# Patient Record
Sex: Male | Born: 1990 | Hispanic: Yes | Marital: Married | Smoking: Current every day smoker
Health system: Southern US, Community
[De-identification: ages and names within clinical notes are randomized; demographics above are authoritative.]

---

## 2016-06-10 ENCOUNTER — Emergency Department
Admission: EM | Admit: 2016-06-10 | Discharge: 2016-06-10 | Disposition: A | Discharge status: Home / Self Care | Payer: Self-pay | Attending: Emergency Medicine | Admitting: Emergency Medicine

## 2016-06-10 DIAGNOSIS — K529 Noninfective gastroenteritis and colitis, unspecified: Secondary | ICD-10-CM | POA: Insufficient documentation

## 2016-06-10 DIAGNOSIS — R8299 Other abnormal findings in urine: Secondary | ICD-10-CM | POA: Insufficient documentation

## 2016-06-10 LAB — CBC
HEMATOCRIT: 49 % (ref 40.0–52.0)
Hemoglobin: 17.2 g/dL (ref 13.0–18.0)
MCH: 33.3 pg (ref 26.0–34.0)
MCHC: 35.2 g/dL (ref 32.0–36.0)
MCV: 94.8 fL (ref 80.0–100.0)
Platelets: 426 10*3/uL (ref 150–440)
RBC: 5.17 MIL/uL (ref 4.40–5.90)
RDW: 12.3 % (ref 11.5–14.5)
WBC: 6.9 10*3/uL (ref 3.8–10.6)

## 2016-06-10 LAB — COMPREHENSIVE METABOLIC PANEL
ALBUMIN: 4.5 g/dL (ref 3.5–5.0)
ALT: 35 U/L (ref 17–63)
AST: 34 U/L (ref 15–41)
Alkaline Phosphatase: 90 U/L (ref 38–126)
Anion gap: 10 (ref 5–15)
BUN: 6 mg/dL (ref 6–20)
CHLORIDE: 100 mmol/L — AB (ref 101–111)
CO2: 26 mmol/L (ref 22–32)
Calcium: 9.1 mg/dL (ref 8.9–10.3)
Creatinine, Ser: 0.32 mg/dL — ABNORMAL LOW (ref 0.61–1.24)
GFR calc Af Amer: >60 mL/min (ref >60)
GFR calc non Af Amer: >60 mL/min (ref >60)
GLUCOSE: 113 mg/dL — AB (ref 65–99)
POTASSIUM: 3.7 mmol/L (ref 3.5–5.1)
Sodium: 136 mmol/L (ref 135–145)
Total Bilirubin: 0.8 mg/dL (ref 0.3–1.2)
Total Protein: 8.5 g/dL — ABNORMAL HIGH (ref 6.5–8.1)

## 2016-06-10 LAB — URINALYSIS, COMPLETE (UACMP) WITH MICROSCOPIC
BACTERIA UA: NONE SEEN
Bilirubin Urine: NEGATIVE
Glucose, UA: NEGATIVE mg/dL
Hgb urine dipstick: NEGATIVE
KETONES UR: NEGATIVE mg/dL
Nitrite: NEGATIVE
PH: 8 (ref 5.0–8.0)
Protein, ur: 30 mg/dL — AB
SQUAMOUS EPITHELIAL / LPF: NONE SEEN
Specific Gravity, Urine: 1.012 (ref 1.005–1.030)

## 2016-06-10 LAB — LIPASE, BLOOD: LIPASE: 19 U/L (ref 11–51)

## 2016-06-10 MED ORDER — ONDANSETRON 4 MG PO TBDP: 4.0000 mg | ORAL_TABLET | Freq: Three times a day (TID) | ORAL | 0 refills | Status: DC | PRN

## 2016-06-10 MED ORDER — ONDANSETRON HCL 4 MG/2ML IJ SOLN
INTRAMUSCULAR | Status: AC
  Administered 2016-06-10: 4 mg via INTRAVENOUS
  Filled 2016-06-10: qty 2

## 2016-06-10 MED ORDER — ONDANSETRON HCL 4 MG/2ML IJ SOLN
4.0000 mg | Freq: Once | INTRAMUSCULAR | Status: AC
  Administered 2016-06-10: 4 mg via INTRAVENOUS

## 2016-06-10 MED ORDER — LOPERAMIDE HCL 2 MG PO CAPS
4.0000 mg | ORAL_CAPSULE | Freq: Once | ORAL | Status: AC
Start: 2016-06-10 — End: 2016-06-10
  Administered 2016-06-10: 4 mg via ORAL

## 2016-06-10 MED ORDER — SODIUM CHLORIDE 0.9 % IV BOLUS (SEPSIS)
1000.0000 mL | Freq: Once | INTRAVENOUS | Status: AC
  Administered 2016-06-10: 1000 mL via INTRAVENOUS

## 2016-06-10 MED ORDER — LOPERAMIDE HCL 2 MG PO TABS: 2.0000 mg | ORAL_TABLET | Freq: Four times a day (QID) | ORAL | 0 refills | Status: DC | PRN

## 2016-06-10 MED ORDER — LOPERAMIDE HCL 2 MG PO CAPS
ORAL_CAPSULE | ORAL | Status: AC
  Filled 2016-06-10: qty 2

## 2016-06-10 NOTE — ED Triage Notes (Signed)
Pt reports feeling weak and having abd pain and vomiting today.  No diarrhea.  No fever .  Pt alert.  Interpreter with pt

## 2016-06-10 NOTE — ED Provider Notes (Signed)
[facility] Emergency Department Provider Note  Time seen: 6:59 PM  I have reviewed the triage vital signs and the nursing notes.   HISTORY  Chief Complaint Emesis and Weakness    HPI Martin Anderson is a 26 y.o. male with no past medical history who presents to the emergency department for nausea vomiting and diarrhea. According to the patient for the past 2 days he has been very nauseated with multiple episodes of vomiting if he tries to eat, as well as watery diarrhea. Denies any black or bloody stool. Denies any chest pain or abdominal pain. Denies any fever. Patient states he was concerned because he has not been able to keep down any food today.  No past medical history on file.  There are no active problems to display for this patient.   No past surgical history on file.  Prior to Admission medications   Not on File    No Known Allergies  No family history on file.  Social History Social History  Substance Use Topics  . Smoking status: Not on file  . Smokeless tobacco: Not on file  . Alcohol use Not on file    Review of Systems Constitutional: Negative for fever. Eyes: Negative for visual changes. ENT: Negative for congestion Cardiovascular: Negative for chest pain. Respiratory: Negative for shortness of breath. Gastrointestinal: Negative for abdominal pain. Positive for nausea vomiting and diarrhea Genitourinary: Negative for dysuria. Musculoskeletal: Negative for back pain. Skin: Negative for rash. Neurological: Negative for headache All other ROS negative  ____________________________________________   PHYSICAL EXAM:  VITAL SIGNS: ED Triage Vitals  Enc Vitals Group     BP 06/10/16 1748 (!) 146/83     Pulse Rate 06/10/16 1748 91     Resp 06/10/16 1748 18     Temp 06/10/16 1748 99 F (37.2 C)     Temp Source 06/10/16 1748 Oral     SpO2 06/10/16 1748 97 %     Weight 06/10/16 1749 163 lb (73.9 kg)     Height  06/10/16 1749  (1.651 m)     Head Circumference --      Peak Flow --      Pain Score 06/10/16 1759 8     Pain Loc --      Pain Edu? --      Excl. in GC? --     Constitutional: Alert and oriented. Well appearing and in no distress. Eyes: Normal exam ENT   Head: Normocephalic and atraumatic.   Mouth/Throat: Mucous membranes are moist. Cardiovascular: Normal rate, regular rhythm. No murmur Respiratory: Normal respiratory effort without tachypnea nor retractions. Breath sounds are clear  Gastrointestinal: Soft and nontender. No distention.  Musculoskeletal: Nontender with normal range of motion in all extremities.  Neurologic:  Normal speech. No gross focal neurologic deficits Skin:  Skin is warm, dry and intact.  Psychiatric: Mood and affect are normal.   ____________________________________________    INITIAL IMPRESSION / ASSESSMENT AND PLAN / ED COURSE  Pertinent labs & imaging results that were available during my care of the patient were reviewed by me and considered in my medical decision making (see chart for details).  Patient presents to the emergency department with nausea vomiting diarrhea over the past 2 days. Symptoms are very suggestive of gastroenteritis. Completely nontender abdominal exam. Reassuring labs. 6-30 wbc's on urinalysis however no dysuria. We will add on a urine culture but will hold off on treatment at this time.  Patient's labs are  largely within normal limits. Patient is feeling better after medications. We will discharge patient home with Zofran and loperamide. Patient agreeable to plan.  ____________________________________________   FINAL CLINICAL IMPRESSION(S) / ED DIAGNOSES  Enteritis    Minna Antis, MD 06/10/16 2010

## 2016-06-10 NOTE — ED Notes (Signed)
Dr. Lenard Lance and Darke Nation, spanish interpreter at bedside.  2 days feeling weak, diarrhea, and vomiting. Denies pain. Denies blood in stool. States watery diarrhea. Denies urinary symptoms. States HA, unsure of fever at home. Denies vision changes.

## 2016-06-12 LAB — URINE CULTURE

## 2018-01-19 ENCOUNTER — Telehealth: Payer: Self-pay | Admitting: General Practice

## 2018-01-19 NOTE — Telephone Encounter (Signed)
I spoke with spouse and set up appt 02/19/18. PPW mailed.

## 2018-01-19 NOTE — Telephone Encounter (Signed)
Pt speaks limited English and is requesting to est care. Are you willing to accept as new pt? Pt wife said he is having sleeping and anxiety issues currently but not urgent.

## 2018-01-19 NOTE — Telephone Encounter (Signed)
Ok to transfer. Thanks. May place in open 30 min slot.

## 2018-02-19 ENCOUNTER — Ambulatory Visit: Payer: Self-pay | Admitting: Family Medicine

## 2021-07-06 ENCOUNTER — Other Ambulatory Visit: Payer: Self-pay

## 2021-07-06 ENCOUNTER — Emergency Department (HOSPITAL_COMMUNITY)
Admission: EM | Admit: 2021-07-06 | Discharge: 2021-07-06 | Disposition: A | Discharge status: Home / Self Care | Payer: Self-pay | Attending: Emergency Medicine | Admitting: Emergency Medicine

## 2021-07-06 ENCOUNTER — Encounter (HOSPITAL_COMMUNITY): Payer: Self-pay

## 2021-07-06 ENCOUNTER — Emergency Department (HOSPITAL_COMMUNITY): Payer: Self-pay

## 2021-07-06 DIAGNOSIS — J069 Acute upper respiratory infection, unspecified: Secondary | ICD-10-CM | POA: Insufficient documentation

## 2021-07-06 MED ORDER — ACETAMINOPHEN 500 MG PO TABS
1000.0000 mg | ORAL_TABLET | Freq: Once | ORAL | Status: AC
  Administered 2021-07-06: 1000 mg via ORAL
  Filled 2021-07-06: qty 2

## 2021-07-06 MED ORDER — BENZONATATE 100 MG PO CAPS
200.0000 mg | ORAL_CAPSULE | Freq: Once | ORAL | Status: AC
  Administered 2021-07-06: 200 mg via ORAL
  Filled 2021-07-06: qty 2

## 2021-07-06 MED ORDER — BENZONATATE 200 MG PO CAPS: 200.0000 mg | ORAL_CAPSULE | Freq: Three times a day (TID) | ORAL | 0 refills | Status: DC | PRN

## 2021-07-06 NOTE — ED Provider Notes (Signed)
[facility] EMERGENCY DEPARTMENT Provider Note   CSN: [id number] Arrival date & time: 07/06/21  1300     History  Chief Complaint  Patient presents with   Cough    Martin Anderson is a 31 y.o. male.  Martin Anderson is a 31 y.o. male who is otherwise healthy, presents to the emergency department for evaluation of cough.  Patient reports cough has been present for 2 to 3 days.  He reports that cough is typically dry but occasionally he will cough up some sputum.  He reports if he has a prolonged coughing spell it makes him feel a bit short of breath and have some chest soreness and he has coughed up a little bit of blood-streaked mucus after having a significant coughing spell but no bright red blood.  Some rhinorrhea but no sore throat.  No fevers or chills.  No abdominal pain, vomiting or diarrhea.  Patient's significant other reports that she was diagnosed with rhinovirus 1 week ago, and since the patient suspects he has the same.  He has been using cough drops and Mucinex with mild improvement.  Took a dose of his significant other's Tessalon which seemed to help with his cough significantly.  The history is provided by the patient and a significant other.      Home Medications Prior to Admission medications   Medication Sig Start Date End Date Taking? Authorizing Provider  loperamide (IMODIUM A-D) 2 MG tablet Take 1 tablet (2 mg total) by mouth 4 (four) times daily as needed for diarrhea or loose stools. 06/10/16   Minna Antis, MD  ondansetron (ZOFRAN ODT) 4 MG disintegrating tablet Take 1 tablet (4 mg total) by mouth every 8 (eight) hours as needed for nausea or vomiting. 06/10/16   Minna Antis, MD      Allergies    Patient has no known allergies.    Review of Systems   Review of Systems  Constitutional:  Negative for chills and fever.  HENT:  Positive for congestion. Negative for rhinorrhea and sore throat.   Respiratory:  Positive for  cough. Negative for shortness of breath.   Cardiovascular:  Negative for chest pain.  Gastrointestinal:  Negative for abdominal pain, nausea and vomiting.  Genitourinary:  Negative for dysuria.  Musculoskeletal:  Negative for arthralgias and myalgias.  Skin:  Negative for color change and rash.  Neurological:  Negative for dizziness, syncope and light-headedness.  All other systems reviewed and are negative.  Physical Exam Updated Vital Signs BP 132/90   Pulse 76   Temp 99.1 F (37.3 C) (Oral)   Resp 20   Ht 5\' 2"  (1.575 m)   Wt 73.9 kg   SpO2 99%   BMI 29.81 kg/m  Physical Exam Vitals and nursing note reviewed.  Constitutional:      General: He is not in acute distress.    Appearance: Normal appearance. He is well-developed. He is not ill-appearing or diaphoretic.  HENT:     Head: Normocephalic and atraumatic.     Nose: Congestion and rhinorrhea present.     Mouth/Throat:     Mouth: Mucous membranes are moist.     Pharynx: Oropharynx is clear.     Comments: Posterior oropharynx clear and mucous membranes moist, there is mild erythema but no edema or tonsillar exudates, uvula midline, normal phonation, no trismus, tolerating secretions without difficulty. Eyes:     General:        Right eye: No discharge.  Left eye: No discharge.  Neck:     Comments: No rigidity Cardiovascular:     Rate and Rhythm: Normal rate and regular rhythm.     Heart sounds: Normal heart sounds. No murmur heard.   No friction rub. No gallop.  Pulmonary:     Effort: Pulmonary effort is normal. No respiratory distress.     Breath sounds: Normal breath sounds.     Comments: Respirations equal and unlabored, patient able to speak in full sentences, lungs clear to auscultation bilaterally  Abdominal:     General: Bowel sounds are normal. There is no distension.     Palpations: Abdomen is soft. There is no mass.     Tenderness: There is no abdominal tenderness. There is no guarding.      Comments: Abdomen soft, nondistended, nontender to palpation in all quadrants without guarding or peritoneal signs  Musculoskeletal:        General: No deformity.     Cervical back: Neck supple.  Lymphadenopathy:     Cervical: No cervical adenopathy.  Skin:    General: Skin is warm and dry.     Capillary Refill: Capillary refill takes less than 2 seconds.  Neurological:     Mental Status: He is alert and oriented to person, place, and time.  Psychiatric:        Mood and Affect: Mood normal.        Behavior: Behavior normal.    ED Results / Procedures / Treatments   Labs (all labs ordered are listed, but only abnormal results are displayed) Labs Reviewed - No data to display  EKG None  Radiology DG Chest 2 View  Result Date: 07/06/2021 CLINICAL DATA:  Cough. EXAM: CHEST - 2 VIEW COMPARISON:  None Available. FINDINGS: Low lung volumes. No consolidation. No visible pleural effusions or pneumothorax. Cardiomediastinal silhouette is within normal limits. No displaced fracture. IMPRESSION: Low lung volumes without evidence of acute cardiopulmonary disease. Electronically Signed   By: Feliberto HartsFrederick S Jones M.D.   On: 07/06/2021 14:26    Procedures Procedures    Medications Ordered in ED Medications  benzonatate (TESSALON) capsule 200 mg (has no administration in time range)  acetaminophen (TYLENOL) tablet 1,000 mg (has no administration in time range)    ED Course/ Medical Decision Making/ A&P                           Medical Decision Making Amount and/or Complexity of Data Reviewed Radiology: ordered.   Pt presents with cough.  Differential includes viral URI, pneumonia, bronchitis.  Presentation is less concerning for asthma, COPD, CHF or PE.  Patient did have some blood-streaked mucus but no larger volume or persistent hemoptysis.  Pt is well appearing and vitals are normal. Lungs CTA on exam. Pt CXR negative for acute infiltrate. Patients symptoms are consistent with URI,  likely viral etiology.  Known sick contact with rhinovirus.  Discussed that antibiotics are not indicated for viral infections. Pt will be discharged with symptomatic treatment.  Verbalizes understanding and is agreeable with plan. Pt is hemodynamically stable & in NAD prior to dc. Return precautions discussed, pt expresses understanding and agrees with plan.         Final Clinical Impression(s) / ED Diagnoses Final diagnoses:  Viral URI with cough    Rx / DC Orders ED Discharge Orders          Ordered    benzonatate (TESSALON) 200 MG capsule  3 times  daily PRN        07/06/21 1452              Dartha Lodge, [place] 07/06/21 1541    Sloan Leiter, DO 07/08/21 [phone]

## 2021-07-06 NOTE — ED Triage Notes (Addendum)
Pt arrived POV from home c/o chest pain and a cough that started yesterday. Pt states when he gets to coughing he gets [facility] and coughs up blood and that is when is chest hurts otherwise no pain at this time if he is not coughing.

## 2021-07-06 NOTE — Discharge Instructions (Addendum)
Your symptoms are likely caused by a viral upper respiratory infection. Antibiotics are not helpful in treating viral infection, the virus should run its course in about 7-10 days. Please make sure you are drinking plenty of fluids. You can treat your symptoms supportively with tylenol/ibuprofen for fevers and pains, and prescribed cough medication and throat lozenges to help with cough. If your symptoms are not improving please follow up with you Primary doctor.   If you develop persistent fevers, shortness of breath or difficulty breathing, chest pain, severe headache and neck pain, persistent nausea and vomiting or other new or concerning symptoms return to the Emergency department.

## 2023-06-13 IMAGING — CR DG CHEST 2V
2 series · 2 of 2 positions shown · non-contrast
Comparison: None Available.

CLINICAL DATA: Cough.

EXAM:
CHEST - 2 VIEW

[chest lat]
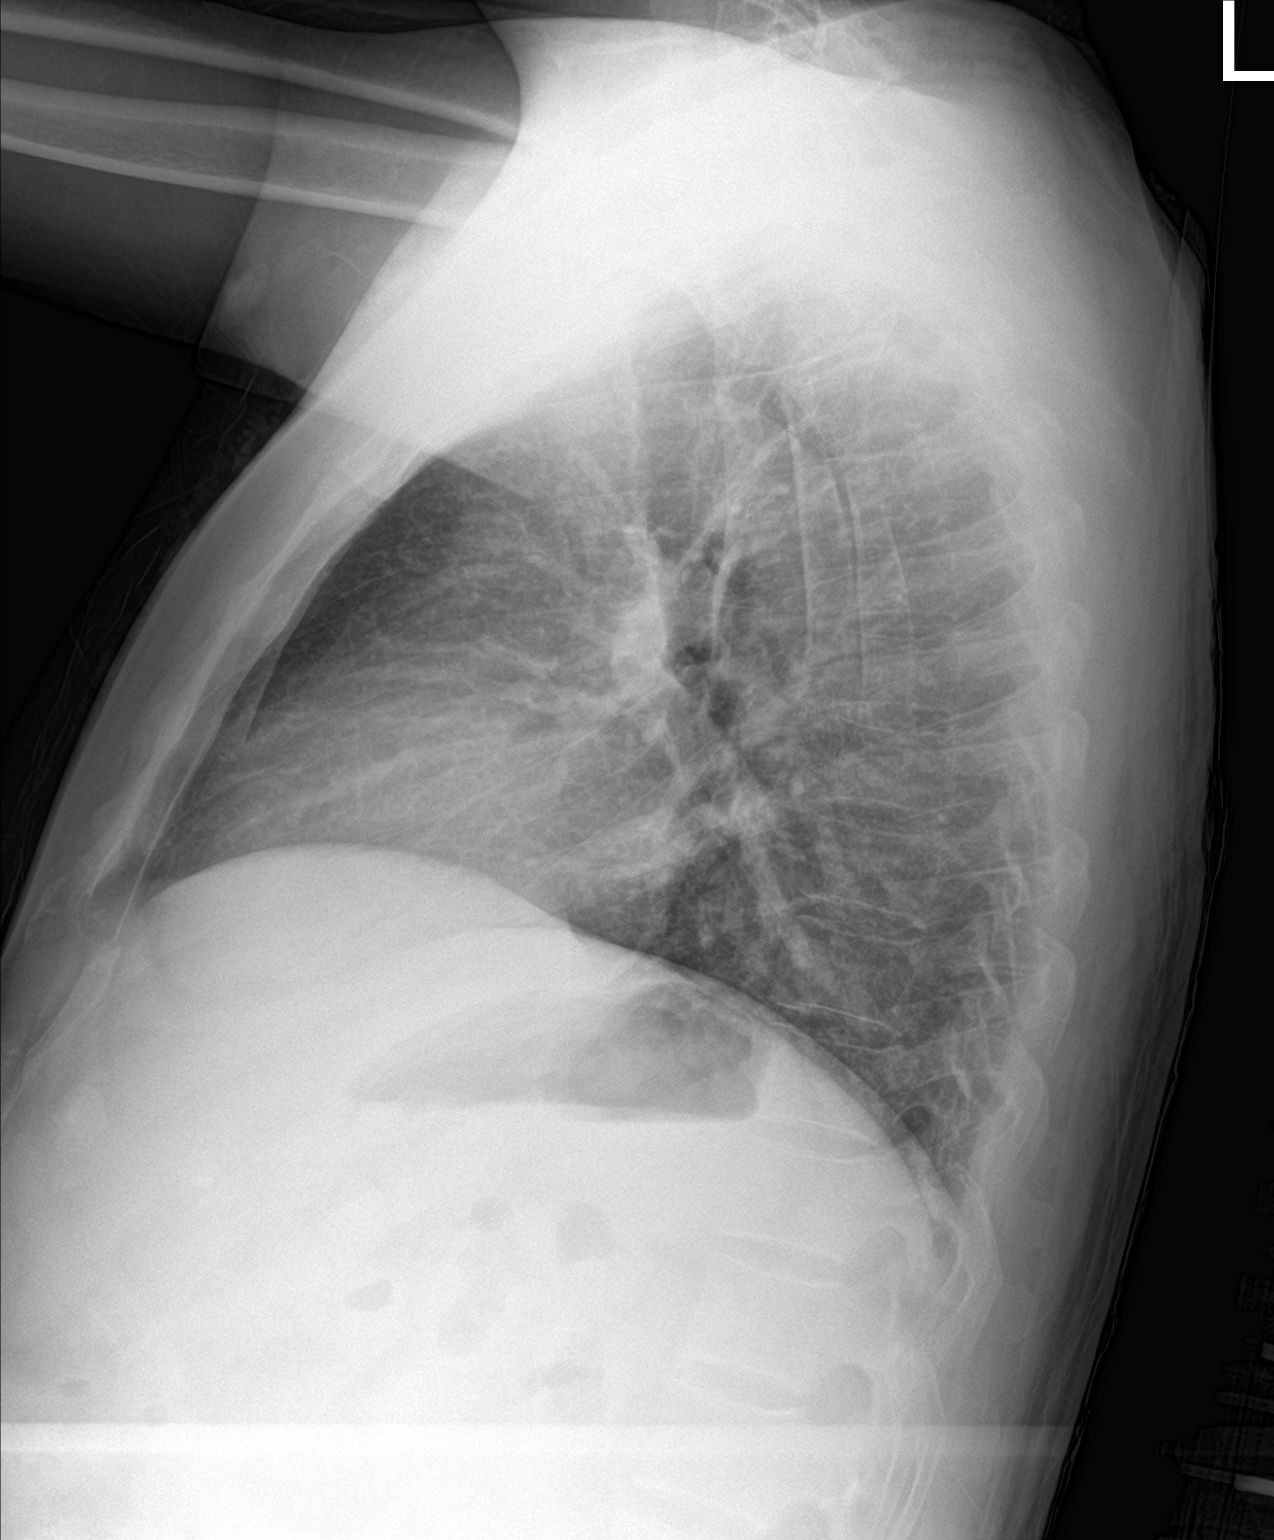

[chest ap]
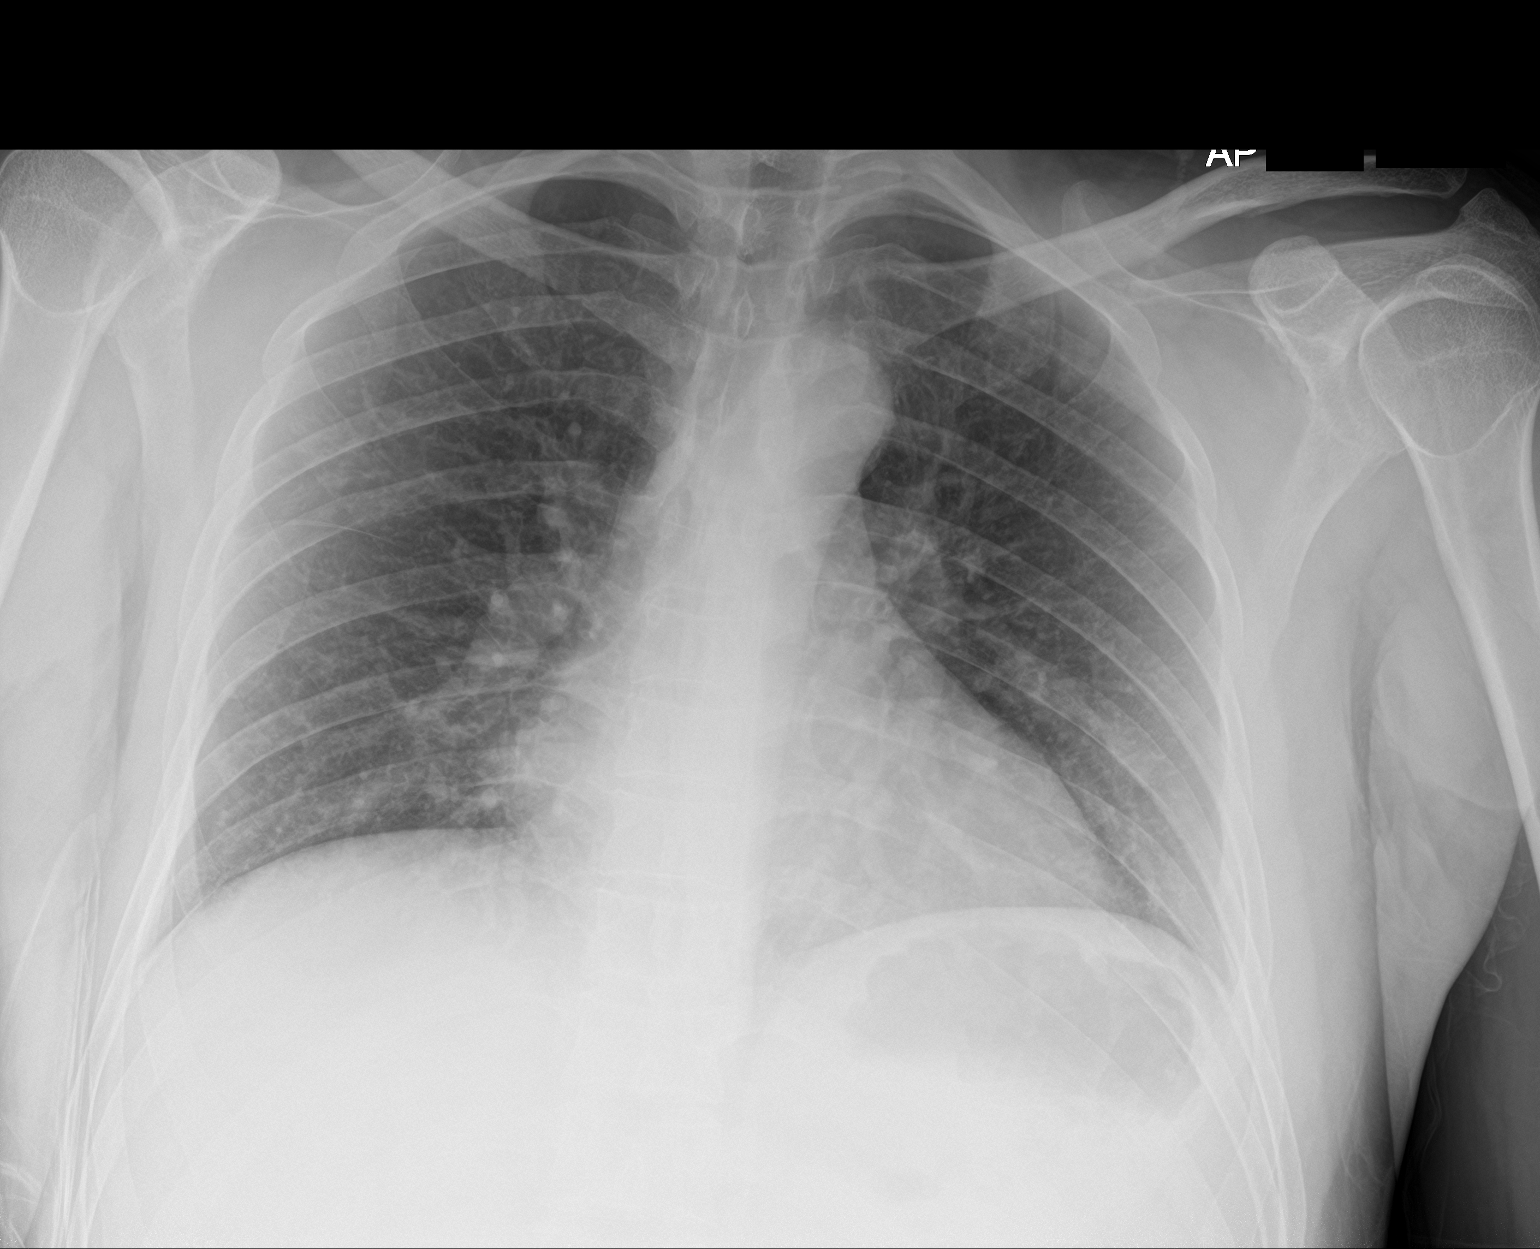

[2 of 2 positions shown; findings below may reference images not displayed]

FINDINGS: Low lung volumes. No consolidation. No visible pleural effusions or
pneumothorax. Cardiomediastinal silhouette is within normal limits.
No displaced fracture.
IMPRESSION: Low lung volumes without evidence of acute cardiopulmonary disease.

## 2023-12-04 ENCOUNTER — Encounter (HOSPITAL_COMMUNITY): Admission: EM | Disposition: A | Payer: Self-pay | Source: Home / Self Care

## 2023-12-04 ENCOUNTER — Inpatient Hospital Stay (HOSPITAL_COMMUNITY): Payer: MEDICAID | Admitting: Anesthesiology

## 2023-12-04 ENCOUNTER — Inpatient Hospital Stay (HOSPITAL_COMMUNITY)
Admission: EM | Admit: 2023-12-04 | Discharge: 2023-12-07 | DRG: 432 | Disposition: A | Discharge status: Home / Self Care | Payer: MEDICAID

## 2023-12-04 ENCOUNTER — Encounter (HOSPITAL_COMMUNITY): Payer: Self-pay | Admitting: Family Medicine

## 2023-12-04 ENCOUNTER — Other Ambulatory Visit: Payer: Self-pay

## 2023-12-04 ENCOUNTER — Emergency Department (HOSPITAL_COMMUNITY): Payer: MEDICAID

## 2023-12-04 DIAGNOSIS — Z79899 Other long term (current) drug therapy: Secondary | ICD-10-CM

## 2023-12-04 DIAGNOSIS — Z6372 Alcoholism and drug addiction in family: Secondary | ICD-10-CM

## 2023-12-04 DIAGNOSIS — K922 Gastrointestinal hemorrhage, unspecified: Principal | ICD-10-CM | POA: Diagnosis present

## 2023-12-04 DIAGNOSIS — K7030 Alcoholic cirrhosis of liver without ascites: Principal | ICD-10-CM | POA: Diagnosis present

## 2023-12-04 DIAGNOSIS — D7589 Other specified diseases of blood and blood-forming organs: Secondary | ICD-10-CM | POA: Diagnosis present

## 2023-12-04 DIAGNOSIS — E871 Hypo-osmolality and hyponatremia: Secondary | ICD-10-CM | POA: Diagnosis present

## 2023-12-04 DIAGNOSIS — R7989 Other specified abnormal findings of blood chemistry: Secondary | ICD-10-CM | POA: Diagnosis present

## 2023-12-04 DIAGNOSIS — K254 Chronic or unspecified gastric ulcer with hemorrhage: Secondary | ICD-10-CM | POA: Diagnosis present

## 2023-12-04 DIAGNOSIS — D62 Acute posthemorrhagic anemia: Secondary | ICD-10-CM | POA: Diagnosis present

## 2023-12-04 DIAGNOSIS — K709 Alcoholic liver disease, unspecified: Secondary | ICD-10-CM

## 2023-12-04 DIAGNOSIS — Z720 Tobacco use: Secondary | ICD-10-CM | POA: Diagnosis present

## 2023-12-04 DIAGNOSIS — K766 Portal hypertension: Secondary | ICD-10-CM

## 2023-12-04 DIAGNOSIS — F17210 Nicotine dependence, cigarettes, uncomplicated: Secondary | ICD-10-CM | POA: Diagnosis present

## 2023-12-04 DIAGNOSIS — I781 Nevus, non-neoplastic: Secondary | ICD-10-CM | POA: Diagnosis present

## 2023-12-04 DIAGNOSIS — F1010 Alcohol abuse, uncomplicated: Secondary | ICD-10-CM | POA: Diagnosis present

## 2023-12-04 DIAGNOSIS — Z811 Family history of alcohol abuse and dependence: Secondary | ICD-10-CM

## 2023-12-04 DIAGNOSIS — Z603 Acculturation difficulty: Secondary | ICD-10-CM | POA: Diagnosis present

## 2023-12-04 DIAGNOSIS — R935 Abnormal findings on diagnostic imaging of other abdominal regions, including retroperitoneum: Secondary | ICD-10-CM | POA: Diagnosis present

## 2023-12-04 DIAGNOSIS — K3189 Other diseases of stomach and duodenum: Secondary | ICD-10-CM

## 2023-12-04 DIAGNOSIS — I8510 Secondary esophageal varices without bleeding: Secondary | ICD-10-CM | POA: Diagnosis present

## 2023-12-04 DIAGNOSIS — D72829 Elevated white blood cell count, unspecified: Secondary | ICD-10-CM | POA: Diagnosis present

## 2023-12-04 DIAGNOSIS — F1020 Alcohol dependence, uncomplicated: Secondary | ICD-10-CM | POA: Diagnosis present

## 2023-12-04 DIAGNOSIS — E876 Hypokalemia: Secondary | ICD-10-CM | POA: Diagnosis present

## 2023-12-04 DIAGNOSIS — D539 Nutritional anemia, unspecified: Secondary | ICD-10-CM | POA: Diagnosis present

## 2023-12-04 DIAGNOSIS — K920 Hematemesis: Secondary | ICD-10-CM | POA: Diagnosis present

## 2023-12-04 DIAGNOSIS — K2921 Alcoholic gastritis with bleeding: Secondary | ICD-10-CM

## 2023-12-04 DIAGNOSIS — D689 Coagulation defect, unspecified: Secondary | ICD-10-CM | POA: Diagnosis present

## 2023-12-04 DIAGNOSIS — I8500 Esophageal varices without bleeding: Secondary | ICD-10-CM

## 2023-12-04 DIAGNOSIS — R933 Abnormal findings on diagnostic imaging of other parts of digestive tract: Secondary | ICD-10-CM

## 2023-12-04 DIAGNOSIS — R197 Diarrhea, unspecified: Secondary | ICD-10-CM | POA: Diagnosis present

## 2023-12-04 DIAGNOSIS — K259 Gastric ulcer, unspecified as acute or chronic, without hemorrhage or perforation: Secondary | ICD-10-CM

## 2023-12-04 HISTORY — PX: ESOPHAGOGASTRODUODENOSCOPY: SHX5428

## 2023-12-04 LAB — TYPE AND SCREEN
ABO/RH(D): O POS
Antibody Screen: NEGATIVE

## 2023-12-04 LAB — COMPREHENSIVE METABOLIC PANEL WITH GFR
ALT: 23 U/L (ref 0–44)
AST: 47 U/L — ABNORMAL HIGH (ref 15–41)
Albumin: 3.7 g/dL (ref 3.5–5.0)
Alkaline Phosphatase: 69 U/L (ref 38–126)
Anion gap: 17 — ABNORMAL HIGH (ref 5–15)
BUN: 17 mg/dL (ref 6–20)
CO2: 23 mmol/L (ref 22–32)
Calcium: 8.7 mg/dL — ABNORMAL LOW (ref 8.9–10.3)
Chloride: 84 mmol/L — ABNORMAL LOW (ref 98–111)
Creatinine, Ser: 0.93 mg/dL (ref 0.61–1.24)
GFR, Estimated: >60 mL/min (ref >60)
Glucose, Bld: 146 mg/dL — ABNORMAL HIGH (ref 70–99)
Potassium: 3.4 mmol/L — ABNORMAL LOW (ref 3.5–5.1)
Sodium: 124 mmol/L — ABNORMAL LOW (ref 135–145)
Total Bilirubin: 2 mg/dL — ABNORMAL HIGH (ref 0.0–1.2)
Total Protein: 7.8 g/dL (ref 6.5–8.1)

## 2023-12-04 LAB — URINALYSIS, ROUTINE W REFLEX MICROSCOPIC
Bilirubin Urine: NEGATIVE
Glucose, UA: NEGATIVE mg/dL
Hgb urine dipstick: NEGATIVE
Ketones, ur: 5 mg/dL — AB
Leukocytes,Ua: NEGATIVE
Nitrite: NEGATIVE
Protein, ur: NEGATIVE mg/dL
Specific Gravity, Urine: 1.039 — ABNORMAL HIGH (ref 1.005–1.030)
pH: 7 (ref 5.0–8.0)

## 2023-12-04 LAB — LIPASE, BLOOD: Lipase: 30 U/L (ref 11–51)

## 2023-12-04 LAB — CBC
HCT: 27.3 % — ABNORMAL LOW (ref 39.0–52.0)
Hemoglobin: 9.6 g/dL — ABNORMAL LOW (ref 13.0–17.0)
MCH: 36.6 pg — ABNORMAL HIGH (ref 26.0–34.0)
MCHC: 35.2 g/dL (ref 30.0–36.0)
MCV: 104.2 fL — ABNORMAL HIGH (ref 80.0–100.0)
Platelets: 204 K/uL (ref 150–400)
RBC: 2.62 MIL/uL — ABNORMAL LOW (ref 4.22–5.81)
RDW: 11.7 % (ref 11.5–15.5)
WBC: 13.6 K/uL — ABNORMAL HIGH (ref 4.0–10.5)
nRBC: 0 % (ref 0.0–0.2)

## 2023-12-04 LAB — ABO/RH: ABO/RH(D): O POS

## 2023-12-04 LAB — POC OCCULT BLOOD, ED: Fecal Occult Blood, POC: POSITIVE — AB

## 2023-12-04 LAB — HEMOGLOBIN AND HEMATOCRIT, BLOOD
HCT: 26.2 % — ABNORMAL LOW (ref 39.0–52.0)
HCT: 24.9 % — ABNORMAL LOW (ref 39.0–52.0)
Hemoglobin: 9.4 g/dL — ABNORMAL LOW (ref 13.0–17.0)
Hemoglobin: 8.7 g/dL — ABNORMAL LOW (ref 13.0–17.0)

## 2023-12-04 LAB — PROTIME-INR
INR: 1.7 — ABNORMAL HIGH (ref 0.8–1.2)
Prothrombin Time: 20.4 s — ABNORMAL HIGH (ref 11.4–15.2)

## 2023-12-04 LAB — PHOSPHORUS: Phosphorus: 3.2 mg/dL (ref 2.5–4.6)

## 2023-12-04 LAB — MAGNESIUM: Magnesium: 2 mg/dL (ref 1.7–2.4)

## 2023-12-04 LAB — HIV ANTIBODY (ROUTINE TESTING W REFLEX): HIV Screen 4th Generation wRfx: NONREACTIVE

## 2023-12-04 SURGERY — EGD (ESOPHAGOGASTRODUODENOSCOPY)
Anesthesia: General

## 2023-12-04 MED ORDER — LORAZEPAM 2 MG/ML IJ SOLN: 1.0000 mg | INTRAMUSCULAR | Status: AC | PRN

## 2023-12-04 MED ORDER — ACETAMINOPHEN 325 MG PO TABS: 650.0000 mg | ORAL_TABLET | Freq: Four times a day (QID) | ORAL | Status: DC | PRN

## 2023-12-04 MED ORDER — MORPHINE SULFATE (PF) 2 MG/ML IV SOLN: 2.0000 mg | INTRAVENOUS | Status: DC | PRN

## 2023-12-04 MED ORDER — MIDAZOLAM HCL (PF) 2 MG/2ML IJ SOLN
INTRAMUSCULAR | Status: DC | PRN
  Administered 2023-12-04: 2 mg via INTRAVENOUS

## 2023-12-04 MED ORDER — SUCCINYLCHOLINE CHLORIDE 200 MG/10ML IV SOSY
PREFILLED_SYRINGE | INTRAVENOUS | Status: DC | PRN
  Administered 2023-12-04: 100 mg via INTRAVENOUS

## 2023-12-04 MED ORDER — PANTOPRAZOLE SODIUM 40 MG IV SOLR
40.0000 mg | Freq: Two times a day (BID) | INTRAVENOUS | Status: DC
  Administered 2023-12-04 – 2023-12-05 (×2): 40 mg via INTRAVENOUS
  Filled 2023-12-04 (×2): qty 10

## 2023-12-04 MED ORDER — ONDANSETRON HCL 4 MG/2ML IJ SOLN: 4.0000 mg | Freq: Four times a day (QID) | INTRAMUSCULAR | Status: DC | PRN

## 2023-12-04 MED ORDER — MORPHINE SULFATE (PF) 4 MG/ML IV SOLN
4.0000 mg | Freq: Once | INTRAVENOUS | Status: AC
  Administered 2023-12-04: 4 mg via INTRAVENOUS
  Filled 2023-12-04: qty 1

## 2023-12-04 MED ORDER — THIAMINE HCL 100 MG/ML IJ SOLN
100.0000 mg | Freq: Every day | INTRAMUSCULAR | Status: DC
  Administered 2023-12-04 – 2023-12-06 (×2): 100 mg via INTRAVENOUS
  Filled 2023-12-04: qty 2

## 2023-12-04 MED ORDER — SODIUM CHLORIDE 0.9 % IV SOLN: INTRAVENOUS | Status: AC

## 2023-12-04 MED ORDER — LORAZEPAM 1 MG PO TABS: 1.0000 mg | ORAL_TABLET | ORAL | Status: AC | PRN

## 2023-12-04 MED ORDER — OXYCODONE HCL 5 MG PO TABS
5.0000 mg | ORAL_TABLET | ORAL | Status: DC | PRN
Start: 2023-12-04 — End: 2023-12-07
  Administered 2023-12-04 – 2023-12-05 (×2): 5 mg via ORAL
  Filled 2023-12-04 (×2): qty 1

## 2023-12-04 MED ORDER — ONDANSETRON HCL 4 MG PO TABS: 4.0000 mg | ORAL_TABLET | Freq: Four times a day (QID) | ORAL | Status: DC | PRN

## 2023-12-04 MED ORDER — ONDANSETRON HCL 4 MG/2ML IJ SOLN
INTRAMUSCULAR | Status: DC | PRN
  Administered 2023-12-04: 4 mg via INTRAVENOUS

## 2023-12-04 MED ORDER — ACETAMINOPHEN 650 MG RE SUPP: 650.0000 mg | Freq: Four times a day (QID) | RECTAL | Status: DC | PRN

## 2023-12-04 MED ORDER — THIAMINE MONONITRATE 100 MG PO TABS
100.0000 mg | ORAL_TABLET | Freq: Every day | ORAL | Status: DC
  Administered 2023-12-05 – 2023-12-07 (×2): 100 mg via ORAL
  Filled 2023-12-04 (×4): qty 1

## 2023-12-04 MED ORDER — SODIUM CHLORIDE 0.9 % IV SOLN: INTRAVENOUS | Status: DC

## 2023-12-04 MED ORDER — LIDOCAINE 2% (20 MG/ML) 5 ML SYRINGE
INTRAMUSCULAR | Status: DC | PRN
Start: 2023-12-04 — End: 2023-12-04
  Administered 2023-12-04: 60 mg via INTRAVENOUS

## 2023-12-04 MED ORDER — ONDANSETRON 4 MG PO TBDP: 4.0000 mg | ORAL_TABLET | Freq: Once | ORAL | Status: DC | PRN

## 2023-12-04 MED ORDER — PANTOPRAZOLE SODIUM 40 MG IV SOLR
80.0000 mg | Freq: Once | INTRAVENOUS | Status: AC
  Administered 2023-12-04: 80 mg via INTRAVENOUS
  Filled 2023-12-04: qty 20

## 2023-12-04 MED ORDER — ONDANSETRON HCL 4 MG/2ML IJ SOLN
4.0000 mg | Freq: Once | INTRAMUSCULAR | Status: AC
  Administered 2023-12-04: 4 mg via INTRAVENOUS
  Filled 2023-12-04: qty 2

## 2023-12-04 MED ORDER — LORAZEPAM 2 MG/ML IJ SOLN: 0.0000 mg | Freq: Four times a day (QID) | INTRAMUSCULAR | Status: AC

## 2023-12-04 MED ORDER — SODIUM CHLORIDE 0.9 % IV BOLUS
1000.0000 mL | Freq: Once | INTRAVENOUS | Status: AC
  Administered 2023-12-04: 1000 mL via INTRAVENOUS

## 2023-12-04 MED ORDER — SODIUM CHLORIDE 0.9 % IV SOLN
50.0000 ug/h | INTRAVENOUS | Status: DC
  Administered 2023-12-04 – 2023-12-05 (×3): 50 ug/h via INTRAVENOUS
  Filled 2023-12-04 (×4): qty 1

## 2023-12-04 MED ORDER — PROPOFOL 10 MG/ML IV BOLUS
INTRAVENOUS | Status: DC | PRN
  Administered 2023-12-04 (×2): 50 mg via INTRAVENOUS
  Administered 2023-12-04: 150 mg via INTRAVENOUS

## 2023-12-04 MED ORDER — MIDAZOLAM HCL 2 MG/2ML IJ SOLN
INTRAMUSCULAR | Status: AC
  Filled 2023-12-04: qty 2

## 2023-12-04 MED ORDER — SODIUM CHLORIDE 0.9% FLUSH
3.0000 mL | Freq: Two times a day (BID) | INTRAVENOUS | Status: DC
  Administered 2023-12-04 – 2023-12-07 (×5): 3 mL via INTRAVENOUS

## 2023-12-04 MED ORDER — FOLIC ACID 1 MG PO TABS
1.0000 mg | ORAL_TABLET | Freq: Every day | ORAL | Status: DC
  Administered 2023-12-05 – 2023-12-07 (×3): 1 mg via ORAL
  Filled 2023-12-04 (×4): qty 1

## 2023-12-04 MED ORDER — OCTREOTIDE LOAD VIA INFUSION
50.0000 ug | Freq: Once | INTRAVENOUS | Status: DC
  Filled 2023-12-04: qty 25

## 2023-12-04 MED ORDER — ADULT MULTIVITAMIN W/MINERALS CH
1.0000 | ORAL_TABLET | Freq: Every day | ORAL | Status: DC
  Administered 2023-12-05 – 2023-12-07 (×3): 1 via ORAL
  Filled 2023-12-04 (×4): qty 1

## 2023-12-04 MED ORDER — IOHEXOL 350 MG/ML SOLN
75.0000 mL | Freq: Once | INTRAVENOUS | Status: AC | PRN
Start: 2023-12-04 — End: 2023-12-04
  Administered 2023-12-04: 75 mL via INTRAVENOUS

## 2023-12-04 MED ORDER — ROCURONIUM BROMIDE 10 MG/ML (PF) SYRINGE
PREFILLED_SYRINGE | INTRAVENOUS | Status: DC | PRN
  Administered 2023-12-04: 4 mg via INTRAVENOUS

## 2023-12-04 MED ORDER — DEXAMETHASONE SOD PHOSPHATE PF 10 MG/ML IJ SOLN
INTRAMUSCULAR | Status: DC | PRN
  Administered 2023-12-04: 5 mg via INTRAVENOUS

## 2023-12-04 MED ORDER — ALBUTEROL SULFATE (2.5 MG/3ML) 0.083% IN NEBU: 2.5000 mg | INHALATION_SOLUTION | Freq: Four times a day (QID) | RESPIRATORY_TRACT | Status: DC | PRN

## 2023-12-04 MED ORDER — LORAZEPAM 2 MG/ML IJ SOLN: 0.0000 mg | Freq: Two times a day (BID) | INTRAMUSCULAR | Status: DC

## 2023-12-04 MED ORDER — NICOTINE 21 MG/24HR TD PT24
21.0000 mg | MEDICATED_PATCH | Freq: Every day | TRANSDERMAL | Status: DC
  Administered 2023-12-04 – 2023-12-07 (×4): 21 mg via TRANSDERMAL
  Filled 2023-12-04 (×4): qty 1

## 2023-12-04 NOTE — ED Notes (Addendum)
Transport called to take pt upstairs 

## 2023-12-04 NOTE — Transfer of Care (Signed)
 Immediate Anesthesia Transfer of Care Note  Patient: Martin Anderson  Procedure(s) Performed: EGD (ESOPHAGOGASTRODUODENOSCOPY)  Patient Location: PACU  Anesthesia Type:General  Level of Consciousness: drowsy  Airway & Oxygen Therapy: Patient Spontanous Breathing  Post-op Assessment: Report given to RN and Patient moving all extremities X 4  Post vital signs: Reviewed and stable  Last Vitals:  Vitals Value Taken Time  BP 105/73 12/04/23 13:30  Temp    Pulse 97 12/04/23 13:34  Resp 13 12/04/23 13:34  SpO2 97 % 12/04/23 13:34  Vitals shown include unfiled device data.  Last Pain:  Vitals:   12/04/23 1327  TempSrc:   PainSc: Asleep         Complications: No notable events documented.

## 2023-12-04 NOTE — ED Triage Notes (Addendum)
 Pt arrives POV with wife complaining of abd pain and n/v/d x a week. Pt drinks about 12-18 beers a day with the last drink being 2 hours prior to arrival. States he started throwing up dark blood clots today. Wife states that pt eyes and skin seem discolored and are more yellow than normal.

## 2023-12-04 NOTE — Interval H&P Note (Signed)
 History and Physical Interval Note:  12/04/2023 12:42 PM  Martin Anderson  has presented today for surgery, with the diagnosis of Hematemesis.  The various methods of treatment have been discussed with the patient and family. After consideration of risks, benefits and other options for treatment, the patient has consented to  Procedure(s): EGD (ESOPHAGOGASTRODUODENOSCOPY) (N/A) as a surgical intervention.  The patient's history has been reviewed, patient examined, no change in status, stable for surgery.  I have reviewed the patient's chart and labs.  Questions were answered to the patient's satisfaction.     Martin Anderson

## 2023-12-04 NOTE — ED Notes (Addendum)
 RN is not sure is the loading dose of the Octreotide was given at 11:45am. RN did not want to give another loading dose.

## 2023-12-04 NOTE — Op Note (Signed)
 [facility] Patient Name: Martin Anderson Procedure Date : 12/04/2023 MRN: [id number] Attending MD: Victory CROME. Legrand , MD, [id number] Date of Birth: 1991/02/01 CSN: [id number] Age: 33 Admit Type: Inpatient Procedure:                Upper GI endoscopy Indications:              Acute post hemorrhagic anemia, Hematemesis                           see consult note. new Dx Etoh-related cirrhosis Providers:                Victory L. Legrand, MD, Jacquelyn Jaci Pierce, RN,                            Felice Sar, Technician Referring MD:             [facility] Medicines:                Monitored Anesthesia Care Complications:            No immediate complications. Estimated Blood Loss:     Estimated blood loss: none. Procedure:                Pre-Anesthesia Assessment:                           - Prior to the procedure, a History and Physical                            was performed, and patient medications and                            allergies were reviewed. The patient's tolerance of                            previous anesthesia was also reviewed. The risks                            and benefits of the procedure and the sedation                            options and risks were discussed with the patient.                            All questions were answered, and informed consent                            was obtained. Prior Anticoagulants: The patient has                            taken no anticoagulant or antiplatelet agents. ASA                            Grade Assessment: IV - A patient with severe  systemic disease that is a constant threat to life.                            After reviewing the risks and benefits, the patient                            was deemed in satisfactory condition to undergo the                            procedure.                           After obtaining informed consent, the endoscope was                             passed under direct vision. Throughout the                            procedure, the patient's blood pressure, pulse, and                            oxygen saturations were monitored continuously. The                            [id number]-H190 ([id number]) Olympus endoscope was introduced                            through the mouth, and advanced to the second part                            of duodenum. The upper GI endoscopy was                            accomplished without difficulty. The patient                            tolerated the procedure well. Scope In: Scope Out: Findings:      There was scant fresh blood in the hypopharynx from either intubation or       suction.      Grade II varices were found in the middle third of the esophagus and in       the lower third of the esophagus. There were a few shallow overlying       erosions. No active bleeding or stigmata of bleeding.      Moderate portal hypertensive gastropathy was found in the entire       examined stomach. Most prominent in fundus and proximal gastric body.       Not actively bleeding or friable with irrigation. No fresh or old blood       in the UGI tract.      The cardia and gastric fundus were normal on retroflexion. No gastric       varices seen. No biopsies were taken in the stomach with an INR of 1.7      A single erosion or very shallow diminutive ulcer with no bleeding was  found in the cardia,along the lesser curvature. There was a faint       central light red spot within the mucosal defect.      The examined duodenum was normal. Impression:               - Grade II esophageal varices.                           - Portal hypertensive gastropathy.                           - Gastric erosion with no bleeding. Cannot be                            certain, but may have been bleeding site. Not                            overlying a varix and not amenable to endoscopic                             intervention.                           - Normal examined duodenum.                           - No specimens collected.                           - Scant fresh blood (not actively bleding during                            observation) in the hypopharynx Recommendation:           - Return patient to hospital ward for ongoing care.                           - Clear liquid diet.                           - Continue octreotide 50 micrograms/hr                           Eventual carvedilol for variceal bleeding                            prophylaxis.(not until closer to discharge)                           Q 12 hour Hgb/Hct x 48 hours                           Pantroprazole 40 mg IV BID                           Watch closely for alcohol withdrawal.  Stool antigen for H pylori Procedure Code(s):        --- Professional ---                           [id number], Esophagogastroduodenoscopy, flexible,                            transoral; diagnostic, including collection of                            specimen(s) by brushing or washing, when performed                            (separate procedure) Diagnosis Code(s):        --- Professional ---                           I85.00, Esophageal varices without bleeding                           K76.6, Portal hypertension                           K31.89, Other diseases of stomach and duodenum                           K25.9, Gastric ulcer, unspecified as acute or                            chronic, without hemorrhage or perforation                           D62, Acute posthemorrhagic anemia                           K92.0, Hematemesis CPT copyright 2022 American Medical Association. All rights reserved. The codes documented in this report are preliminary and upon coder review may  be revised to meet current compliance requirements. Haunani Dickard L. Legrand, MD 12/04/2023 1:29:56 PM This report has been signed electronically. Number of  Addenda: 0

## 2023-12-04 NOTE — Anesthesia Postprocedure Evaluation (Signed)
 Anesthesia Post Note  Patient: Martin Anderson  Procedure(s) Performed: EGD (ESOPHAGOGASTRODUODENOSCOPY)     Patient location during evaluation: PACU Anesthesia Type: General Level of consciousness: awake and alert Pain management: pain level controlled Vital Signs Assessment: post-procedure vital signs reviewed and stable Respiratory status: spontaneous breathing, nonlabored ventilation, respiratory function stable and patient connected to nasal cannula oxygen Cardiovascular status: blood pressure returned to baseline and stable Postop Assessment: no apparent nausea or vomiting Anesthetic complications: no   No notable events documented.  Last Vitals:  Vitals:   12/04/23 1335 12/04/23 1340  BP:  110/72  Pulse: 97 93  Resp: 13 13  Temp:    SpO2: 97% 97%    Last Pain:  Vitals:   12/04/23 1327  TempSrc:   PainSc: Asleep                 Rome Ade

## 2023-12-04 NOTE — Anesthesia Preprocedure Evaluation (Signed)
 Anesthesia Evaluation  Patient identified by MRN, date of birth, ID band Patient awake  General Assessment Comment:  Hematemesis workup. Last hematemesis last night  Reviewed: Allergy & Precautions, NPO status , Patient's Chart, lab work & pertinent test results  History of Anesthesia Complications Negative for: history of anesthetic complications  Airway Mallampati: II  TM Distance: >3 FB Neck ROM: Full    Dental no notable dental hx. (+) Teeth Intact   Pulmonary neg sleep apnea, neg COPD, Current Smoker and Patient abstained from smoking.   Pulmonary exam normal breath sounds clear to auscultation       Cardiovascular Exercise Tolerance: Good METS(-) hypertension(-) CAD and (-) Past MI negative cardio ROS (-) dysrhythmias  Rhythm:Regular Rate:Normal - Systolic murmurs    Neuro/Psych negative neurological ROS  negative psych ROS   GI/Hepatic ,neg GERD  ,,(+)     substance abuse  alcohol useSigns of early cirrhosis on CT   Endo/Other  neg diabetes    Renal/GU negative Renal ROS     Musculoskeletal   Abdominal   Peds  Hematology   Anesthesia Other Findings History reviewed. No pertinent past medical history.  Reproductive/Obstetrics                              Anesthesia Physical Anesthesia Plan  ASA: 3  Anesthesia Plan: General   Post-op Pain Management: Minimal or no pain anticipated   Induction: Intravenous and Rapid sequence  PONV Risk Score and Plan: 1 and Ondansetron , Dexamethasone and Midazolam  Airway Management Planned: Oral ETT and Video Laryngoscope Planned  Additional Equipment: None  Intra-op Plan:   Post-operative Plan: Extubation in OR  Informed Consent: I have reviewed the patients History and Physical, chart, labs and discussed the procedure including the risks, benefits and alternatives for the proposed anesthesia with the patient or authorized  representative who has indicated his/her understanding and acceptance.     Dental advisory given and Interpreter used for interview (bedside video spanish interpreter)  Plan Discussed with: CRNA and Surgeon  Anesthesia Plan Comments: (Discussed risks of anesthesia with patient, including PONV, sore throat, lip/dental/eye damage. Rare risks discussed as well, such as cardiorespiratory and neurological sequelae, and allergic reactions. Discussed the role of CRNA in patient's perioperative care. Patient understands.)        Anesthesia Quick Evaluation

## 2023-12-04 NOTE — ED Notes (Signed)
Walked patient to the bathroom patient did well 

## 2023-12-04 NOTE — H&P (View-Only) (Signed)
 Referring Provider: Leita Chancy PA-C Primary Care Physician:  Pcp, No Primary Gastroenterologist:  Dr. Sampson   Reason for Consultation: Hematemesis, upper GI bleed  HPI: Martin Anderson is a 33 y.o. male with a past medical history of alcohol use disorder chronic tobacco use.  He presented to [facility] ED with dark red hematemesis x 2 in setting of heavy alcohol use.  Labs in the ED showed WBC count of 13.6.  Hemoglobin level of 9.6 and 9.4. Recent baseline Hg level unknown, Hg 17.2 in 2018. Hematocrit 27.3.  MCV 104.2.  Platelets 204.  Sodium 124.  Potassium 3.4.  BUN 17.  Creatinine 0.93.  Total bili 2.0.  Alk phos 69.  AST 47.  ALT 23.  Lipase 30.  INR 1.7.  FOBT positive.  MELD 3.0: 22.  MDF 34.7. Started on Pantoprazole IV twice daily.  CTAP showed morphologic features possibly suggestive of early cirrhosis and to low-attenuation foci suggestive of possible focal fatty deposition versus benign or malignant liver lesion.  Patient endorsed vomiting hematemesis emesis x 2 since arriving in the ED.  A GI consult was requested for further evaluation regarding upper GI bleed/hematemesis.  Patient speaks Spanish.  His wife was available via telephone speaker and she interpreted to facilitate communication as she speaks [place] fluently.  He developed central upper abdominal pain and started vomiting dark red blood with chunks of blood clots numerous times yesterday.  He endorsed having similar central abdominal pain above the umbilicus a few weeks ago which lasted for few days then abated, recurred yesterday.  He started passing nonbloody diarrhea 2 days ago.  No bloody or black stools.  He typically passes a normal formed brown stool most days at home.  He took Alka-Seltzer yesterday without symptomatic relief.  He drinks 24-30 beers daily for the past year and prior to that drank 6-12 beers on the weekends.  Last alcohol intake was Wednesday 11/2020 at 3 PM.  No drug use.  He smokes  1 to 2 packs of cigarettes daily since age of 86.  No prior history of upper GI bleed but he recalls going to the ED in 2018 with nausea, vomiting diarrhea and a could not recall if he vomited up any blood at that time.  He denied ever having an EGD and was not previously seen by GI.  No NSAID use.  Not on any blood thinners.  Father with history of alcohol associated liver disease, deceased secondary to heart disease. Daughter at the bedside.  Prior to Admission medications   Medication Sig Start Date End Date Taking? Authorizing Provider  doxylamine, Sleep, (SLEEP AID) 25 MG tablet Take 25 mg by mouth at bedtime.   Yes [provider]    Current Facility-Administered Medications  Medication Dose Route Frequency Provider Last Rate Last Admin   0.9 %  sodium chloride  infusion   Intravenous Continuous Opyd, Timothy S, MD       ondansetron  (ZOFRAN -ODT) disintegrating tablet 4 mg  4 mg Oral Once PRN Pollina, Lonni PARAS, MD       pantoprazole (PROTONIX) injection 40 mg  40 mg Intravenous Q12H Opyd, Timothy S, MD       Current Outpatient Medications  Medication Sig Dispense Refill   doxylamine, Sleep, (SLEEP AID) 25 MG tablet Take 25 mg by mouth at bedtime.      Allergies as of 12/04/2023   (No Known Allergies)    No family history on file.  Social History  Socioeconomic History   Marital status: Married    Spouse name: Not on file   Number of children: Not on file   Years of education: Not on file   Highest education level: Not on file  Occupational History   Not on file  Tobacco Use   Smoking status: Every Day    Types: Cigarettes   Smokeless tobacco: Never  Substance and Sexual Activity   Alcohol use: Not on file   Drug use: Not on file   Sexual activity: Not on file  Other Topics Concern   Not on file  Social History Narrative   Not on file   Social Drivers of Health   Financial Resource Strain: Not on file  Food Insecurity: Not on file  Transportation  Needs: Not on file  Physical Activity: Not on file  Stress: Not on file  Social Connections: Not on file  Intimate Partner Violence: Not on file    Review of Systems: Gen: Denies fever, sweats or chills. No weight loss.  CV: Denies chest pain, palpitations or edema. Resp: Denies cough, shortness of breath of hemoptysis.  GI: See HPI.  GU : Denies urinary burning, blood in urine, increased urinary frequency or incontinence. MS: Denies joint pain, muscles aches or weakness. Derm: Denies rash, itchiness, skin lesions or unhealing ulcers. Psych: Denies depression, anxiety, memory loss or confusion. Heme: Denies easy bruising, bleeding. Neuro:  Denies headaches, dizziness or paresthesias. Endo:  Denies any problems with DM, thyroid or adrenal function.  Physical Exam: Vital signs in last 24 hours: Temp:  [98.4 F (36.9 C)-99.2 F (37.3 C)] 98.4 F (36.9 C) (10/24 0726) Pulse Rate:  [100-108] 100 (10/24 0715) Resp:  [11-20] 14 (10/24 0715) BP: (93-128)/(66-80) 123/72 (10/24 0715) SpO2:  [98 %] 98 % (10/24 0715)   General:  Alert 33 year old male in no acute distress. Head:  Normocephalic and atraumatic. Eyes:  No scleral icterus, brown scleral injections bilaterally.  Conjunctiva pink. Ears:  Normal auditory acuity. Nose:  No deformity, discharge or lesions. Mouth:  Dentition intact. No ulcers or lesions.  Neck:  Supple. No lymphadenopathy or thyromegaly.  Lungs: Breath sounds clear throughout. No wheezes, rhonchi or crackles.  Heart: Regular rate and rhythm, no murmurs. Abdomen: Soft, nondistended.  Mild tenderness above the umbilicus without rebound or guarding.  Positive bowel sounds to all 4 quadrants.  No hepatosplenomegaly.  No palpable mass. Rectal: Deferred. Musculoskeletal:  Symmetrical without gross deformities.  Pulses:  Normal pulses noted. Extremities:  Without clubbing or edema. Neurologic:  Alert and oriented x 4. No focal deficits.  No asterixis. Skin:  Intact  without significant lesions or rashes. Psych:  Alert and cooperative. Normal mood and affect.  Intake/Output from previous day: No intake/output data recorded. Intake/Output this shift: No intake/output data recorded.  Lab Results: Recent Labs    12/04/23 0142 12/04/23 0526  WBC 13.6*  --   HGB 9.6* 9.4*  HCT 27.3* 26.2*  PLT 204  --    BMET Recent Labs    12/04/23 0142  NA 124*  K 3.4*  CL 84*  CO2 23  GLUCOSE 146*  BUN 17  CREATININE 0.93  CALCIUM 8.7*   LFT Recent Labs    12/04/23 0142  PROT 7.8  ALBUMIN 3.7  AST 47*  ALT 23  ALKPHOS 69  BILITOT 2.0*   PT/INR Recent Labs    12/04/23 0142  LABPROT 20.4*  INR 1.7*   Hepatitis Panel No results for input(s): HEPBSAG, HCVAB, HEPAIGM, HEPBIGM  in the last 72 hours.  MELD 3.0: 22 at 12/04/2023  1:42 AM MELD-Na: 25 at 12/04/2023  1:42 AM Calculated from: Serum Creatinine: 0.93 mg/dL (Using min of 1 mg/dL) at 89/75/7974  8:57 AM Serum Sodium: 124 mmol/L (Using min of 125 mmol/L) at 12/04/2023  1:42 AM Total Bilirubin: 2 mg/dL at 89/75/7974  8:57 AM Serum Albumin: 3.7 g/dL (Using max of 3.5 g/dL) at 89/75/7974  8:57 AM INR(ratio): 1.7 at 12/04/2023  1:42 AM Age at listing (hypothetical): 33 years Sex: Male at 12/04/2023  1:42 AM   Studies/Results: CT ABDOMEN PELVIS W CONTRAST Result Date: 12/04/2023 EXAM: CT ABDOMEN AND PELVIS WITH CONTRAST 12/04/2023 04:56:59 AM TECHNIQUE: CT of the abdomen and pelvis was performed with the administration of 75 mL of iohexol (OMNIPAQUE) 350 MG/ML injection. Multiplanar reformatted images are provided for review. Automated exposure control, iterative reconstruction, and/or weight-based adjustment of the mA/kV was utilized to reduce the radiation dose to as low as reasonably achievable. COMPARISON: None available. CLINICAL HISTORY: Abdominal pain, acute, nonlocalized; vomiting blood. 75cc omni350; Pt arrives POV with wife complaining of abd pain and n/v/d x a week. Pt  drinks about 12-18 beers a day with the last drink being 2 hours prior to arrival. States he started throwing up dark blood clots today. Wife states that pt eyes and skin seem ; discolored and are more yellow than normal. FINDINGS: LOWER CHEST: No acute abnormality. LIVER: There is relative hypertrophy of the lateral segment of the left lobe of the liver and caudate lobe of liver. Low-density structure within segment 7 measures 0.6 cm (image 34/4). Within segment 4b there is a focal area of low density measuring approximately 2 cm (image 24/4). Adjacent focal area of low attenuation at a slightly more inferior level measures 2.3 x 1.7 cm. These findings are too small to characterize and indeterminate. GALLBLADDER AND BILE DUCTS: Gallbladder is unremarkable. No biliary ductal dilatation. SPLEEN: No acute abnormality. PANCREAS: No acute abnormality. ADRENAL GLANDS: No acute abnormality. KIDNEYS, URETERS AND BLADDER: No stones in the kidneys or ureters. No hydronephrosis. No perinephric or periureteral stranding. Urinary bladder is unremarkable. GI AND BOWEL: Stomach demonstrates no acute abnormality. There is no bowel obstruction. Appendix demonstrates no acute abnormality. PERITONEUM AND RETROPERITONEUM: No ascites. No free air. VASCULATURE: Aorta is normal in caliber. LYMPH NODES: No lymphadenopathy. REPRODUCTIVE ORGANS: Prostate gland is unremarkable. BONES AND SOFT TISSUES: No acute osseous abnormality. No focal soft tissue abnormality. IMPRESSION: 1. No acute findings. 2. Morphologic features of the liver which may be seen with early cirrhosis including relative hypertrophy of the lateral segment of the left lobe and caudate lobe of liver. 3. There are 2 foci of low attenuation within segment 4b of the liver which are indeterminate. Potential diagnostic considerations include focal fatty deposition versus benign or malignant liver lesions. Advise more definitive characterization with contrast-enhanced imaging MRI  of the liver. Electronically signed by: Waddell Calk MD 12/04/2023 06:14 AM EDT RP Workstation: [id number]    IMPRESSION/PLAN:  33 year old male with upper GI bleed/hematemesis with acute anemia in setting of heavy alcohol use. Hg 9.6. FOBT positive. Hemodynamically stable. - NPO  - IV fluids per the hospitalist - Continue Pantoprazole 40 mg IV twice daily - Continue ondansetron  4 mg PR IV every 6 hours as needed - EGD benefits and risks discussed including risk with sedation, risk of bleeding, perforation and infection.  Timing to be determined.  Await further recommendations per Dr. Legrand. - Transfuse for hemoglobin level < 7 or as needed  if symptomatic - CBC in a.m.  Central abdominal pain above the umbilicus for few days, no significant abdominal pain at this time.  CTAP without acute intra-abdominal pathology to explain pain. - Pain management per the hospitalist  Alcohol use disorder.  Last alcohol intake was 3 PM on 11/2020. - Patient counseled complete alcohol abstinence imperative - CIWA protocol  CTAP showed evidence of possible early cirrhosis and 2 low-attenuation foci suggestive of possible focal fatty deposition versus benign or malignant liver lesion. AST 47. ALT 23. Albumin 3.7. INR 1.7.  Normal platelet count.  MELD 3:0: 22. - AFP in a.m. 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 I agree with the Advanced Practitioner's note, impression and recommendations, and have recorded additional findings, impressions and recommendations below. I performed a substantive portion of this encounter (>50% time spent), including a complete performance of the medical decision making.  My  additional thoughts are as follows:  Patient seen and examined in the endoscopy preprocedure area with nursing present at the bedside and a Spanish video phone interpreter.  Patient had also just had an interview with our anesthesiologist.  Clinical history as detailed above with reported hematemesis, anemia, heavy alcohol use and imaging consistent with probable cirrhosis.  Additional exam findings of that he has mildly icteric with 1 upper chest wall spider nevus, fluent speech no asterixis and normal mentation (no signs of encephalopathy).  His abdomen is soft and nondistended with left lobe liver enlarged on inspiration and no spleen tip palpable.  No peripheral edema.  His vital signs are normal at this time.  No ascites on CTAP INR 1.7.  Hemoglobin 9.6 on admission with high MCV, 9.4 on recheck several hours later.    Acute upper GI bleeding in patient with history of alcohol abuse and probable cirrhosis based on imaging, exam findings and coagulopathy.  He needs an upper endoscopy today for localization and possible control of bleeding.  Possible peptic ulcer disease portal gastropathy, esophageal or gastric varices, neoplasia and others.  Patient was ordered for octreotide 50 mcg bolus and 50 mcg/h bolus that will be started here in the endoscopy department.  No ascites, so no antibiotics needed  Patient was agreeable to an upper endoscopy after a thorough discussion of the procedure and risks.  The benefits and risks of the planned procedure(s) were described in detail with the patient or (when appropriate) their health care proxy.  Risks were outlined as including, but not limited to, bleeding, infection, perforation, adverse medication reaction leading to cardiac or pulmonary decompensation, pancreatitis (if ERCP).  The limitation of incomplete mucosal visualization was also discussed.  No guarantees or warranties were given.  Patient at increased risk for cardiopulmonary  complications of procedure due to medical comorbidities.  Further recommendations to follow endoscopic findings.  He will clearly need further inpatient management after today's procedure. _________________  This consultation required a high degree of medical decision making due to the nature and complexity of the acute condition(s) being evaluated as well as the patient's medical comorbidities.  Martin Anderson Office:[phone]

## 2023-12-04 NOTE — ED Notes (Signed)
Please update family

## 2023-12-04 NOTE — ED Provider Notes (Signed)
 [facility] AT [facility] Provider Note   CSN: [id number] Arrival date & time: 12/04/23  0118     Patient presents with: Abdominal Pain   Martin Anderson is a 33 y.o. male.   33 year old male brought in by wife with concern for abdominal pain with vomiting blood (dark red) for the past 2 days. Also diarrhea, unknown if bloody. Reports drinking 32 beers daily. Has had hematemesis in the past but has not been seen for it. No prior GI evaluation. Feels weak, dizzy, light headed.        Prior to Admission medications   Medication Sig Start Date End Date Taking? Authorizing Provider  doxylamine, Sleep, (SLEEP AID) 25 MG tablet Take 25 mg by mouth at bedtime.   Yes [provider]    Allergies: Patient has no known allergies.    Review of Systems Negative except as per HPI Updated Vital Signs BP 112/66   Pulse (!) 108   Temp 99.2 F (37.3 C)   Resp 13   SpO2 98%   Physical Exam Vitals and nursing note reviewed. Exam conducted with a chaperone present.  Constitutional:      General: He is not in acute distress.    Appearance: He is well-developed. He is ill-appearing. He is not diaphoretic.  HENT:     Head: Normocephalic and atraumatic.  Cardiovascular:     Rate and Rhythm: Regular rhythm. Tachycardia present.     Heart sounds: Normal heart sounds.  Pulmonary:     Effort: Pulmonary effort is normal.     Breath sounds: Normal breath sounds.  Abdominal:     Palpations: Abdomen is soft.     Tenderness: There is generalized abdominal tenderness.  Genitourinary:    Rectum: Guaiac result positive.  Skin:    General: Skin is warm and dry.     Findings: No erythema or rash.  Neurological:     Mental Status: He is alert and oriented to person, place, and time.  Psychiatric:        Behavior: Behavior normal.     (all labs ordered are listed, but only abnormal results are displayed) Labs Reviewed  COMPREHENSIVE METABOLIC  PANEL WITH GFR - Abnormal; Notable for the following components:      Result Value   Sodium 124 (*)    Potassium 3.4 (*)    Chloride 84 (*)    Glucose, Bld 146 (*)    Calcium 8.7 (*)    AST 47 (*)    Total Bilirubin 2.0 (*)    Anion gap 17 (*)    All other components within normal limits  CBC - Abnormal; Notable for the following components:   WBC 13.6 (*)    RBC 2.62 (*)    Hemoglobin 9.6 (*)    HCT 27.3 (*)    MCV 104.2 (*)    MCH 36.6 (*)    All other components within normal limits  PROTIME-INR - Abnormal; Notable for the following components:   Prothrombin Time 20.4 (*)    INR 1.7 (*)    All other components within normal limits  HEMOGLOBIN AND HEMATOCRIT, BLOOD - Abnormal; Notable for the following components:   Hemoglobin 9.4 (*)    HCT 26.2 (*)    All other components within normal limits  POC OCCULT BLOOD, ED - Abnormal; Notable for the following components:   Fecal Occult Blood, POC Positive (*)    All other components within normal limits  LIPASE, BLOOD  URINALYSIS, ROUTINE W REFLEX MICROSCOPIC  TYPE AND SCREEN  ABO/RH    EKG: None  Radiology: CT ABDOMEN PELVIS W CONTRAST Result Date: 12/04/2023 EXAM: CT ABDOMEN AND PELVIS WITH CONTRAST 12/04/2023 04:56:59 AM TECHNIQUE: CT of the abdomen and pelvis was performed with the administration of 75 mL of iohexol (OMNIPAQUE) 350 MG/ML injection. Multiplanar reformatted images are provided for review. Automated exposure control, iterative reconstruction, and/or weight-based adjustment of the mA/kV was utilized to reduce the radiation dose to as low as reasonably achievable. COMPARISON: None available. CLINICAL HISTORY: Abdominal pain, acute, nonlocalized; vomiting blood. 75cc omni350; Pt arrives POV with wife complaining of abd pain and n/v/d x a week. Pt drinks about 12-18 beers a day with the last drink being 2 hours prior to arrival. States he started throwing up dark blood clots today. Wife states that pt eyes and skin  seem ; discolored and are more yellow than normal. FINDINGS: LOWER CHEST: No acute abnormality. LIVER: There is relative hypertrophy of the lateral segment of the left lobe of the liver and caudate lobe of liver. Low-density structure within segment 7 measures 0.6 cm (image 34/4). Within segment 4b there is a focal area of low density measuring approximately 2 cm (image 24/4). Adjacent focal area of low attenuation at a slightly more inferior level measures 2.3 x 1.7 cm. These findings are too small to characterize and indeterminate. GALLBLADDER AND BILE DUCTS: Gallbladder is unremarkable. No biliary ductal dilatation. SPLEEN: No acute abnormality. PANCREAS: No acute abnormality. ADRENAL GLANDS: No acute abnormality. KIDNEYS, URETERS AND BLADDER: No stones in the kidneys or ureters. No hydronephrosis. No perinephric or periureteral stranding. Urinary bladder is unremarkable. GI AND BOWEL: Stomach demonstrates no acute abnormality. There is no bowel obstruction. Appendix demonstrates no acute abnormality. PERITONEUM AND RETROPERITONEUM: No ascites. No free air. VASCULATURE: Aorta is normal in caliber. LYMPH NODES: No lymphadenopathy. REPRODUCTIVE ORGANS: Prostate gland is unremarkable. BONES AND SOFT TISSUES: No acute osseous abnormality. No focal soft tissue abnormality. IMPRESSION: 1. No acute findings. 2. Morphologic features of the liver which may be seen with early cirrhosis including relative hypertrophy of the lateral segment of the left lobe and caudate lobe of liver. 3. There are 2 foci of low attenuation within segment 4b of the liver which are indeterminate. Potential diagnostic considerations include focal fatty deposition versus benign or malignant liver lesions. Advise more definitive characterization with contrast-enhanced imaging MRI of the liver. Electronically signed by: Waddell Calk MD 12/04/2023 06:14 AM EDT RP Workstation: [id number]     .Critical Care  Performed by: Martin Leita LABOR,  PA-C Authorized by: Martin Leita LABOR, PA-C   Critical care provider statement:    Critical care time (minutes):  30   Critical care was time spent personally by me on the following activities:  Development of treatment plan with patient or surrogate, discussions with consultants, evaluation of patient's response to treatment, examination of patient, ordering and review of laboratory studies, ordering and review of radiographic studies, ordering and performing treatments and interventions, pulse oximetry, re-evaluation of patient's condition and review of old charts    Medications Ordered in the ED  ondansetron  (ZOFRAN -ODT) disintegrating tablet 4 mg (has no administration in time range)  sodium chloride  0.9 % bolus 1,000 mL (has no administration in time range)  pantoprazole (PROTONIX) injection 40 mg (has no administration in time range)  0.9 %  sodium chloride  infusion (has no administration in time range)  pantoprazole (PROTONIX) injection 80 mg (80 mg Intravenous Given 12/04/23  9568)  ondansetron  (ZOFRAN ) injection 4 mg (4 mg Intravenous Given 12/04/23 0430)  morphine (PF) 4 MG/ML injection 4 mg (4 mg Intravenous Given 12/04/23 0430)  iohexol (OMNIPAQUE) 350 MG/ML injection 75 mL (75 mLs Intravenous Contrast Given 12/04/23 0457)                                    Medical Decision Making Amount and/or Complexity of Data Reviewed Labs: ordered. Radiology: ordered.  Risk Prescription drug management. Decision regarding hospitalization.   This patient presents to the ED for concern of hematemesis, this involves an extensive number of treatment options, and is a complaint that carries with it a high risk of complications and morbidity.  The differential diagnosis includes gastritis, varices, anemia   Co morbidities / Chronic conditions that complicate the patient evaluation  Heavy alcohol use   Additional history obtained:  Additional history obtained from EMR External records  from outside source obtained and reviewed including no recent labs on file   Lab Tests:  I Ordered, and personally interpreted labs.  The pertinent results include: CBC with hemoglobin of 9.6, no recent on file (previously 17 in 2018). Repeat H&H with hgb 9.4, stable. CMP with hyponatremia at 124. Bili elevated at 2.0. INR elevated at 1.7. Hemoccult +   Imaging Studies ordered:  I ordered imaging studies including CT abdomen/pelvis  I independently visualized and interpreted imaging which showed no acute process, no free air I agree with the radiologist interpretation    Problem List / ED Course / Critical interventions / Medication management  33 year old male brought in by wife with concern for bloody emesis x 2 days with abdominal pain. Reports heavy alcohol use, has had hematemsis in the past but has never been seen for this previously. Reports feeling dizzy and unwell. He was mildly hypotensive and tachycardic on arrival. Labs assessed, provided with protonix and IVF, morphine and zofran . Found to have +hemoccult, CBC with hgb 9.6, no recent for comparison, stable on recheck. INR elevated. AST and bili elevated. Lipase normal. Na 124. No vomiting while in the ER. CT without acute findings. Plan is for admission for hyponatremia, GI bleed/likely alcoholic gastritis. Request GI team to see patient in the morning, secure message sent to Dr. Charlanne with [facility]. Requests hospitalist admit.  I ordered medication including protonix,  morphine, zofran   Reevaluation of the patient after these medicines showed that the patient pain improved, no vomiting while in the ER I have reviewed the patients home medicines and have made adjustments as needed   Consultations Obtained:  I requested consultation with the hospitalist, Dr. Charlanne,  and discussed lab and imaging findings as well as pertinent plan - they recommend: will consult for admission. Request consult with [facility] team today via  secure message.    Social Determinants of Health:  Lives at home with wife   Test / Admission - Considered:  admit      Final diagnoses:  Acute upper GI bleed  Hyponatremia  Acute alcoholic gastritis with hemorrhage    ED Discharge Orders     None          Martin Anderson 12/04/23 [id number]    Martin Lonni PARAS, MD 12/05/23 0425

## 2023-12-04 NOTE — Consult Note (Addendum)
 Referring Provider: Leita Chancy PA-C Primary Care Physician:  Pcp, No Primary Gastroenterologist:  Dr. Sampson   Reason for Consultation: Hematemesis, upper GI bleed  HPI: Martin Anderson is a 33 y.o. male with a past medical history of alcohol use disorder chronic tobacco use.  He presented to [facility] ED with dark red hematemesis x 2 in setting of heavy alcohol use.  Labs in the ED showed WBC count of 13.6.  Hemoglobin level of 9.6 and 9.4. Recent baseline Hg level unknown, Hg 17.2 in 2018. Hematocrit 27.3.  MCV 104.2.  Platelets 204.  Sodium 124.  Potassium 3.4.  BUN 17.  Creatinine 0.93.  Total bili 2.0.  Alk phos 69.  AST 47.  ALT 23.  Lipase 30.  INR 1.7.  FOBT positive.  MELD 3.0: 22.  MDF 34.7. Started on Pantoprazole IV twice daily.  CTAP showed morphologic features possibly suggestive of early cirrhosis and to low-attenuation foci suggestive of possible focal fatty deposition versus benign or malignant liver lesion.  Patient endorsed vomiting hematemesis emesis x 2 since arriving in the ED.  A GI consult was requested for further evaluation regarding upper GI bleed/hematemesis.  Patient speaks Spanish.  His wife was available via telephone speaker and she interpreted to facilitate communication as she speaks [place] fluently.  He developed central upper abdominal pain and started vomiting dark red blood with chunks of blood clots numerous times yesterday.  He endorsed having similar central abdominal pain above the umbilicus a few weeks ago which lasted for few days then abated, recurred yesterday.  He started passing nonbloody diarrhea 2 days ago.  No bloody or black stools.  He typically passes a normal formed brown stool most days at home.  He took Alka-Seltzer yesterday without symptomatic relief.  He drinks 24-30 beers daily for the past year and prior to that drank 6-12 beers on the weekends.  Last alcohol intake was Wednesday 11/2020 at 3 PM.  No drug use.  He smokes  1 to 2 packs of cigarettes daily since age of 86.  No prior history of upper GI bleed but he recalls going to the ED in 2018 with nausea, vomiting diarrhea and a could not recall if he vomited up any blood at that time.  He denied ever having an EGD and was not previously seen by GI.  No NSAID use.  Not on any blood thinners.  Father with history of alcohol associated liver disease, deceased secondary to heart disease. Daughter at the bedside.  Prior to Admission medications   Medication Sig Start Date End Date Taking? Authorizing Provider  doxylamine, Sleep, (SLEEP AID) 25 MG tablet Take 25 mg by mouth at bedtime.   Yes [provider]    Current Facility-Administered Medications  Medication Dose Route Frequency Provider Last Rate Last Admin   0.9 %  sodium chloride  infusion   Intravenous Continuous Opyd, Timothy S, MD       ondansetron  (ZOFRAN -ODT) disintegrating tablet 4 mg  4 mg Oral Once PRN Pollina, Lonni PARAS, MD       pantoprazole (PROTONIX) injection 40 mg  40 mg Intravenous Q12H Opyd, Timothy S, MD       Current Outpatient Medications  Medication Sig Dispense Refill   doxylamine, Sleep, (SLEEP AID) 25 MG tablet Take 25 mg by mouth at bedtime.      Allergies as of 12/04/2023   (No Known Allergies)    No family history on file.  Social History  Socioeconomic History   Marital status: Married    Spouse name: Not on file   Number of children: Not on file   Years of education: Not on file   Highest education level: Not on file  Occupational History   Not on file  Tobacco Use   Smoking status: Every Day    Types: Cigarettes   Smokeless tobacco: Never  Substance and Sexual Activity   Alcohol use: Not on file   Drug use: Not on file   Sexual activity: Not on file  Other Topics Concern   Not on file  Social History Narrative   Not on file   Social Drivers of Health   Financial Resource Strain: Not on file  Food Insecurity: Not on file  Transportation  Needs: Not on file  Physical Activity: Not on file  Stress: Not on file  Social Connections: Not on file  Intimate Partner Violence: Not on file    Review of Systems: Gen: Denies fever, sweats or chills. No weight loss.  CV: Denies chest pain, palpitations or edema. Resp: Denies cough, shortness of breath of hemoptysis.  GI: See HPI.  GU : Denies urinary burning, blood in urine, increased urinary frequency or incontinence. MS: Denies joint pain, muscles aches or weakness. Derm: Denies rash, itchiness, skin lesions or unhealing ulcers. Psych: Denies depression, anxiety, memory loss or confusion. Heme: Denies easy bruising, bleeding. Neuro:  Denies headaches, dizziness or paresthesias. Endo:  Denies any problems with DM, thyroid or adrenal function.  Physical Exam: Vital signs in last 24 hours: Temp:  [98.4 F (36.9 C)-99.2 F (37.3 C)] 98.4 F (36.9 C) (10/24 0726) Pulse Rate:  [100-108] 100 (10/24 0715) Resp:  [11-20] 14 (10/24 0715) BP: (93-128)/(66-80) 123/72 (10/24 0715) SpO2:  [98 %] 98 % (10/24 0715)   General:  Alert 33 year old male in no acute distress. Head:  Normocephalic and atraumatic. Eyes:  No scleral icterus, brown scleral injections bilaterally.  Conjunctiva pink. Ears:  Normal auditory acuity. Nose:  No deformity, discharge or lesions. Mouth:  Dentition intact. No ulcers or lesions.  Neck:  Supple. No lymphadenopathy or thyromegaly.  Lungs: Breath sounds clear throughout. No wheezes, rhonchi or crackles.  Heart: Regular rate and rhythm, no murmurs. Abdomen: Soft, nondistended.  Mild tenderness above the umbilicus without rebound or guarding.  Positive bowel sounds to all 4 quadrants.  No hepatosplenomegaly.  No palpable mass. Rectal: Deferred. Musculoskeletal:  Symmetrical without gross deformities.  Pulses:  Normal pulses noted. Extremities:  Without clubbing or edema. Neurologic:  Alert and oriented x 4. No focal deficits.  No asterixis. Skin:  Intact  without significant lesions or rashes. Psych:  Alert and cooperative. Normal mood and affect.  Intake/Output from previous day: No intake/output data recorded. Intake/Output this shift: No intake/output data recorded.  Lab Results: Recent Labs    12/04/23 0142 12/04/23 0526  WBC 13.6*  --   HGB 9.6* 9.4*  HCT 27.3* 26.2*  PLT 204  --    BMET Recent Labs    12/04/23 0142  NA 124*  K 3.4*  CL 84*  CO2 23  GLUCOSE 146*  BUN 17  CREATININE 0.93  CALCIUM 8.7*   LFT Recent Labs    12/04/23 0142  PROT 7.8  ALBUMIN 3.7  AST 47*  ALT 23  ALKPHOS 69  BILITOT 2.0*   PT/INR Recent Labs    12/04/23 0142  LABPROT 20.4*  INR 1.7*   Hepatitis Panel No results for input(s): HEPBSAG, HCVAB, HEPAIGM, HEPBIGM  in the last 72 hours.  MELD 3.0: 22 at 12/04/2023  1:42 AM MELD-Na: 25 at 12/04/2023  1:42 AM Calculated from: Serum Creatinine: 0.93 mg/dL (Using min of 1 mg/dL) at 89/75/7974  8:57 AM Serum Sodium: 124 mmol/L (Using min of 125 mmol/L) at 12/04/2023  1:42 AM Total Bilirubin: 2 mg/dL at 89/75/7974  8:57 AM Serum Albumin: 3.7 g/dL (Using max of 3.5 g/dL) at 89/75/7974  8:57 AM INR(ratio): 1.7 at 12/04/2023  1:42 AM Age at listing (hypothetical): 33 years Sex: Male at 12/04/2023  1:42 AM   Studies/Results: CT ABDOMEN PELVIS W CONTRAST Result Date: 12/04/2023 EXAM: CT ABDOMEN AND PELVIS WITH CONTRAST 12/04/2023 04:56:59 AM TECHNIQUE: CT of the abdomen and pelvis was performed with the administration of 75 mL of iohexol (OMNIPAQUE) 350 MG/ML injection. Multiplanar reformatted images are provided for review. Automated exposure control, iterative reconstruction, and/or weight-based adjustment of the mA/kV was utilized to reduce the radiation dose to as low as reasonably achievable. COMPARISON: None available. CLINICAL HISTORY: Abdominal pain, acute, nonlocalized; vomiting blood. 75cc omni350; Pt arrives POV with wife complaining of abd pain and n/v/d x a week. Pt  drinks about 12-18 beers a day with the last drink being 2 hours prior to arrival. States he started throwing up dark blood clots today. Wife states that pt eyes and skin seem ; discolored and are more yellow than normal. FINDINGS: LOWER CHEST: No acute abnormality. LIVER: There is relative hypertrophy of the lateral segment of the left lobe of the liver and caudate lobe of liver. Low-density structure within segment 7 measures 0.6 cm (image 34/4). Within segment 4b there is a focal area of low density measuring approximately 2 cm (image 24/4). Adjacent focal area of low attenuation at a slightly more inferior level measures 2.3 x 1.7 cm. These findings are too small to characterize and indeterminate. GALLBLADDER AND BILE DUCTS: Gallbladder is unremarkable. No biliary ductal dilatation. SPLEEN: No acute abnormality. PANCREAS: No acute abnormality. ADRENAL GLANDS: No acute abnormality. KIDNEYS, URETERS AND BLADDER: No stones in the kidneys or ureters. No hydronephrosis. No perinephric or periureteral stranding. Urinary bladder is unremarkable. GI AND BOWEL: Stomach demonstrates no acute abnormality. There is no bowel obstruction. Appendix demonstrates no acute abnormality. PERITONEUM AND RETROPERITONEUM: No ascites. No free air. VASCULATURE: Aorta is normal in caliber. LYMPH NODES: No lymphadenopathy. REPRODUCTIVE ORGANS: Prostate gland is unremarkable. BONES AND SOFT TISSUES: No acute osseous abnormality. No focal soft tissue abnormality. IMPRESSION: 1. No acute findings. 2. Morphologic features of the liver which may be seen with early cirrhosis including relative hypertrophy of the lateral segment of the left lobe and caudate lobe of liver. 3. There are 2 foci of low attenuation within segment 4b of the liver which are indeterminate. Potential diagnostic considerations include focal fatty deposition versus benign or malignant liver lesions. Advise more definitive characterization with contrast-enhanced imaging MRI  of the liver. Electronically signed by: Waddell Calk MD 12/04/2023 06:14 AM EDT RP Workstation: [id number]    IMPRESSION/PLAN:  33 year old male with upper GI bleed/hematemesis with acute anemia in setting of heavy alcohol use. Hg 9.6. FOBT positive. Hemodynamically stable. - NPO  - IV fluids per the hospitalist - Continue Pantoprazole 40 mg IV twice daily - Continue ondansetron  4 mg PR IV every 6 hours as needed - EGD benefits and risks discussed including risk with sedation, risk of bleeding, perforation and infection.  Timing to be determined.  Await further recommendations per Dr. Legrand. - Transfuse for hemoglobin level < 7 or as needed  if symptomatic - CBC in a.m.  Central abdominal pain above the umbilicus for few days, no significant abdominal pain at this time.  CTAP without acute intra-abdominal pathology to explain pain. - Pain management per the hospitalist  Alcohol use disorder.  Last alcohol intake was 3 PM on 11/2020. - Patient counseled complete alcohol abstinence imperative - CIWA protocol  CTAP showed evidence of possible early cirrhosis and 2 low-attenuation foci suggestive of possible focal fatty deposition versus benign or malignant liver lesion. AST 47. ALT 23. Albumin 3.7. INR 1.7.  Normal platelet count.  MELD 3:0: 22. - AFP in a.m. 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 I agree with the Advanced Practitioner's note, impression and recommendations, and have recorded additional findings, impressions and recommendations below. I performed a substantive portion of this encounter (>50% time spent), including a complete performance of the medical decision making.  My  additional thoughts are as follows:  Patient seen and examined in the endoscopy preprocedure area with nursing present at the bedside and a Spanish video phone interpreter.  Patient had also just had an interview with our anesthesiologist.  Clinical history as detailed above with reported hematemesis, anemia, heavy alcohol use and imaging consistent with probable cirrhosis.  Additional exam findings of that he has mildly icteric with 1 upper chest wall spider nevus, fluent speech no asterixis and normal mentation (no signs of encephalopathy).  His abdomen is soft and nondistended with left lobe liver enlarged on inspiration and no spleen tip palpable.  No peripheral edema.  His vital signs are normal at this time.  No ascites on CTAP INR 1.7.  Hemoglobin 9.6 on admission with high MCV, 9.4 on recheck several hours later.    Acute upper GI bleeding in patient with history of alcohol abuse and probable cirrhosis based on imaging, exam findings and coagulopathy.  He needs an upper endoscopy today for localization and possible control of bleeding.  Possible peptic ulcer disease portal gastropathy, esophageal or gastric varices, neoplasia and others.  Patient was ordered for octreotide 50 mcg bolus and 50 mcg/h bolus that will be started here in the endoscopy department.  No ascites, so no antibiotics needed  Patient was agreeable to an upper endoscopy after a thorough discussion of the procedure and risks.  The benefits and risks of the planned procedure(s) were described in detail with the patient or (when appropriate) their health care proxy.  Risks were outlined as including, but not limited to, bleeding, infection, perforation, adverse medication reaction leading to cardiac or pulmonary decompensation, pancreatitis (if ERCP).  The limitation of incomplete mucosal visualization was also discussed.  No guarantees or warranties were given.  Patient at increased risk for cardiopulmonary  complications of procedure due to medical comorbidities.  Further recommendations to follow endoscopic findings.  He will clearly need further inpatient management after today's procedure. _________________  This consultation required a high degree of medical decision making due to the nature and complexity of the acute condition(s) being evaluated as well as the patient's medical comorbidities.  Victory LITTIE Brand III Office:[phone]

## 2023-12-04 NOTE — ED Notes (Addendum)
 Theda, daughter [phone].. she wants to be called before going to surgery

## 2023-12-04 NOTE — ED Notes (Signed)
 Pt transported to endoscopy

## 2023-12-04 NOTE — Anesthesia Procedure Notes (Addendum)
 Procedure Name: Intubation Date/Time: 12/04/2023 12:52 PM  Performed by: Dianne Burnard RAMAN, CRNAPre-anesthesia Checklist: Patient identified, Emergency Drugs available, Suction available and Patient being monitored Patient Re-evaluated:Patient Re-evaluated prior to induction Oxygen Delivery Method: Circle system utilized Preoxygenation: Pre-oxygenation with 100% oxygen Induction Type: IV induction and Rapid sequence Laryngoscope Size: McGrath and 4 Grade View: Grade I Tube type: Oral Tube size: 7.5 mm Number of attempts: 1 Airway Equipment and Method: Oral airway and Video-laryngoscopy Placement Confirmation: ETT inserted through vocal cords under direct vision, positive ETCO2 and breath sounds checked- equal and bilateral Secured at: 22 cm Tube secured with: Tape Dental Injury: Teeth and Oropharynx as per pre-operative assessment  Comments: Hx upper GI bleeding, RSI

## 2023-12-04 NOTE — H&P (Addendum)
 History and Physical    Patient: Martin Anderson FMW:[id number] DOB: August 29, 1990 DOA: 12/04/2023 DOS: the patient was seen and examined on 12/04/2023 PCP: Pcp, No  Patient coming from: Home  Chief Complaint:  Chief Complaint  Patient presents with   Abdominal Pain   HPI: Martin Anderson is a 33 y.o. male with medical history significant of alcohol abuse presents with vomiting blood and inability to eat or drink for the past three days.  History is obtained with use of Spanish interpreter services.  He has been experiencing hematemesis and an inability to retain even water for the past two to three days. He has not been able to eat for approximately three days and reports melena, with the last bowel movement occurring last night.  He experiences abdominal pain localized to the epigastric region. He has a history of a similar episode three years ago.  He has a significant history of alcohol use, consuming up to eighteen drinks a day, with his last drink being [employer] Light beer last night around 10 PM. He denies the use of ibuprofen, BC Goody powders, naproxen, or Aleve.  He smokes one to two packs of cigarettes per day.  In the emergency department patient was noted to be afebrile with heart rates elevated up to 108, and all other vital signs relatively maintained.  Labs significant for WBC 13.6, hemoglobin 9.6-> 9.4 with elevated MCV and MCH, sodium 124, potassium 3.4, chloride 84, BUN 17, creatinine 0.93, glucose 146, anion gap 17, AST 47, ALT 23, total bilirubin 2, and INR 1.7.  Stool acquired noted to be positive.  CT scan of the abdomen pelvis have been obtained with contrast without acute findings and morphologic features of the liver concerning for early cirrhosis with 2 foci of low-attenuation within 4B of the liver which are indeterminate.  Patient was typed and screened for possible need of blood products.  GI was formally consulted. Patient had been given 1 L normal saline IV  fluids, Zofran  4 mg IV, morphine 4 mg IV, and Protonix 80 mg IV.    Review of Systems: As mentioned in the history of present illness. All other systems reviewed and are negative. No past medical history on file. No past surgical history on file. Social History:  reports that he has been smoking cigarettes. He has never used smokeless tobacco. No history on file for alcohol use and drug use.  No Known Allergies  No family history on file.  Prior to Admission medications   Medication Sig Start Date End Date Taking? Authorizing Provider  doxylamine, Sleep, (SLEEP AID) 25 MG tablet Take 25 mg by mouth at bedtime.   Yes [provider]    Physical Exam: Vitals:   12/04/23 0645 12/04/23 0700 12/04/23 0715 12/04/23 0726  BP: 112/73 128/78 123/72   Pulse:   100   Resp: 20 13 14    Temp:    98.4 F (36.9 C)  TempSrc:    Oral  SpO2:   98%      Constitutional: Young male currently in no acute distress Eyes: PERRL, lids are normal ENMT: Mucous membranes are moist.  Normal dentition.  Neck: normal, supple,   Respiratory: clear to auscultation bilaterally, no wheezing, no crackles. Normal respiratory effort. No accessory muscle use.  Cardiovascular: Regular rate and rhythm, no murmurs / rubs / gallops. No extremity edema. 2+ pedal pulses.   Abdomen: Epigastric tenderness to palpation present Musculoskeletal: no clubbing / cyanosis. No joint deformity upper and lower extremities.  Good ROM, no contractures. Normal muscle tone.  Skin: no rashes, lesions, ulcers. No induration Neurologic: CN 2-12 grossly intact. Sensation intact, DTR normal. Strength 5/5 in all 4.  Psychiatric: Normal judgment and insight. Alert and oriented x 3. Normal mood.   Data Reviewed:  Reviewed labs, imaging, and pertinent records as documented  Assessment and Plan:   Hematemesis with nausea Upper GI bleed Acute blood loss anemia Patient presents with a 2 to 3-day history of abdominal pain, nausea  and vomiting blood.  Lipase within normal limits.  Hemoglobin down to 9.6->9.4, but records note previously have been within normal limits.  Notes history of similar episode 3 years ago.  CT scan of the chest abdomen pelvis did not show any clear source of bleeding.  Patient had been given Protonix 80 mg IV x 1 dose.  Suspecting upper GI source.  GI was formally consulted and plan on taking for EGD. - Admit to progressive bed - Aspiration precautions with elevation head of the bed - N.p.o. for procedure - Serial monitoring of H&H transfuse blood products if hemoglobin drops below  - Continue Protonix 40 mg IV twice daily - Antiemetics as needed - GI formally consulted we will follow-up for any further recommendations  Leukocytosis Acute.  WBC elevated at 13.6.  Question if reactive to above - Recheck CBC tomorrow morning  Elevated liver function test Coagulopathy Abnormal CT of abdomen and pelvis Labs significant for sodium 124, AST 47, total bilirubin 2, and INR 1.7.  AST to ALT ratio is consistent with alcohol abuse.  CT noted concern for early changes consistent with early cirrhosis.  Coagulopathy likely related to alcohol abuse. - Continue to monitor  Hyponatremia Sodium noted to be 124.  Thought likely related with nausea and vomiting and/or alcohol abuse. - Continue to monitor sodium levels  Alcohol abuse Patient reported drinking 18+ beers per day on average with last drink yesterday prior to coming to the hospital. - CIWA protocols initiated with Ativan scheduled and as needed - Thiamine, folic acid, MVI  Tobacco abuse Patient reports smoking 1 to 2 packs of cigarettes per day on average. - Nicotine patch offered - Counseled patient on need of cessation of tobacco use  DVT prophylaxis: SCDs Advance Care Planning:   Code Status: Full Code   Consults: GI Family Communication: Wife updated at bedside Severity of Illness: The appropriate patient status for this patient is  INPATIENT. Inpatient status is judged to be reasonable and necessary in order to provide the required intensity of service to ensure the patient's safety. The patient's presenting symptoms, physical exam findings, and initial radiographic and laboratory data in the context of their chronic comorbidities is felt to place them at high risk for further clinical deterioration. Furthermore, it is not anticipated that the patient will be medically stable for discharge from the hospital within 2 midnights of admission.   * I certify that at the point of admission it is my clinical judgment that the patient will require inpatient hospital care spanning beyond 2 midnights from the point of admission due to high intensity of service, high risk for further deterioration and high frequency of surveillance required.*  Author: Maximino DELENA Sharps, MD 12/04/2023 8:25 AM  For on call review [email or web address].

## 2023-12-05 DIAGNOSIS — K7030 Alcoholic cirrhosis of liver without ascites: Secondary | ICD-10-CM | POA: Diagnosis present

## 2023-12-05 DIAGNOSIS — K766 Portal hypertension: Secondary | ICD-10-CM | POA: Diagnosis present

## 2023-12-05 LAB — CBC
HCT: 25.9 % — ABNORMAL LOW (ref 39.0–52.0)
HCT: 23.7 % — ABNORMAL LOW (ref 39.0–52.0)
Hemoglobin: 9.1 g/dL — ABNORMAL LOW (ref 13.0–17.0)
Hemoglobin: 8.4 g/dL — ABNORMAL LOW (ref 13.0–17.0)
MCH: 36.3 pg — ABNORMAL HIGH (ref 26.0–34.0)
MCH: 37.3 pg — ABNORMAL HIGH (ref 26.0–34.0)
MCHC: 35.1 g/dL (ref 30.0–36.0)
MCHC: 35.4 g/dL (ref 30.0–36.0)
MCV: 103.2 fL — ABNORMAL HIGH (ref 80.0–100.0)
MCV: 105.3 fL — ABNORMAL HIGH (ref 80.0–100.0)
Platelets: 190 K/uL (ref 150–400)
Platelets: 198 K/uL (ref 150–400)
RBC: 2.51 MIL/uL — ABNORMAL LOW (ref 4.22–5.81)
RBC: 2.25 MIL/uL — ABNORMAL LOW (ref 4.22–5.81)
RDW: 11.4 % — ABNORMAL LOW (ref 11.5–15.5)
RDW: 11.5 % (ref 11.5–15.5)
WBC: 8.3 K/uL (ref 4.0–10.5)
WBC: 13.5 K/uL — ABNORMAL HIGH (ref 4.0–10.5)
nRBC: 0 % (ref 0.0–0.2)
nRBC: 0 % (ref 0.0–0.2)

## 2023-12-05 LAB — PROTIME-INR
INR: 1.6 — ABNORMAL HIGH (ref 0.8–1.2)
Prothrombin Time: 19.6 s — ABNORMAL HIGH (ref 11.4–15.2)

## 2023-12-05 LAB — IRON AND TIBC
Iron: 97 ug/dL (ref 45–182)
Saturation Ratios: 30 % (ref 17.9–39.5)
TIBC: 326 ug/dL (ref 250–450)
UIBC: 229 ug/dL

## 2023-12-05 LAB — COMPREHENSIVE METABOLIC PANEL WITH GFR
ALT: 20 U/L (ref 0–44)
AST: 43 U/L — ABNORMAL HIGH (ref 15–41)
Albumin: 3.2 g/dL — ABNORMAL LOW (ref 3.5–5.0)
Alkaline Phosphatase: 55 U/L (ref 38–126)
Anion gap: 10 (ref 5–15)
BUN: 10 mg/dL (ref 6–20)
CO2: 22 mmol/L (ref 22–32)
Calcium: 7.9 mg/dL — ABNORMAL LOW (ref 8.9–10.3)
Chloride: 91 mmol/L — ABNORMAL LOW (ref 98–111)
Creatinine, Ser: 0.6 mg/dL — ABNORMAL LOW (ref 0.61–1.24)
GFR, Estimated: >60 mL/min (ref >60)
Glucose, Bld: 142 mg/dL — ABNORMAL HIGH (ref 70–99)
Potassium: 3.8 mmol/L (ref 3.5–5.1)
Sodium: 123 mmol/L — ABNORMAL LOW (ref 135–145)
Total Bilirubin: 1.5 mg/dL — ABNORMAL HIGH (ref 0.0–1.2)
Total Protein: 7.2 g/dL (ref 6.5–8.1)

## 2023-12-05 LAB — RETICULOCYTES
Immature Retic Fract: 15.9 % (ref 2.3–15.9)
RBC.: 2.19 MIL/uL — ABNORMAL LOW (ref 4.22–5.81)
Retic Count, Absolute: 93.5 K/uL (ref 19.0–186.0)
Retic Ct Pct: 4.3 % — ABNORMAL HIGH (ref 0.4–3.1)

## 2023-12-05 LAB — FERRITIN: Ferritin: 147 ng/mL (ref 24–336)

## 2023-12-05 LAB — FOLATE: Folate: >20 ng/mL (ref >5.9)

## 2023-12-05 LAB — VITAMIN B12: Vitamin B-12: 327 pg/mL (ref 180–914)

## 2023-12-05 MED ORDER — SODIUM CHLORIDE 0.9 % IV SOLN: INTRAVENOUS | Status: DC

## 2023-12-05 MED ORDER — CARVEDILOL 3.125 MG PO TABS
3.1250 mg | ORAL_TABLET | Freq: Two times a day (BID) | ORAL | Status: DC
  Administered 2023-12-06 – 2023-12-07 (×3): 3.125 mg via ORAL
  Filled 2023-12-05 (×4): qty 1

## 2023-12-05 MED ORDER — PANTOPRAZOLE SODIUM 40 MG PO TBEC
40.0000 mg | DELAYED_RELEASE_TABLET | Freq: Two times a day (BID) | ORAL | Status: DC
  Administered 2023-12-06 – 2023-12-07 (×3): 40 mg via ORAL
  Filled 2023-12-05 (×3): qty 1

## 2023-12-05 NOTE — Discharge Instructions (Signed)
 [facility] Espaol: Cuando nos llames al [phone], la esperanza est en juego. Esta iniciativa est en asociacin con las siete LME/MCO del estado y [facility] [employer] en [place]. [facility] es confidencial y est disponible las 24 horas del da, los 7 [address]  [address] de la [place]. Martin Anderson persona siempre responder, cuando necesites ayuda.  En tiempos de crisis: Educational psychologist, inc.  Mobile Crisis Management proporciona una respuesta inmediata a las crisis, 24/7.  Llame al [phone]  [facility] para el Centro de Teacher, adult education a Servicios MH/DD/SA est disponible las 24 horas del da, los 7 das de la [place]. Los especialistas en Servicio al Cliente lo ayudarn a Clinical research associate un proveedor de crisis que coincida bien con sus necesidades. Su nmero local es: [phone]  centro de Salud Conductual del [place]/Cuidado Urgente de Salud Conductual (BHUC) PIO, asesoramiento individual, gestin de medicamentos [address] [address], [place], [place] ([place]) [phone] Llame para las horas de admisin; [place] y sin seguro  Proveedores Ambulatorios:  Servicios de Alcohol y [place] (ADS) Asesoramiento grupal e individual. [address]  [address], [place], [place] [phone] [place]: [phone] [place]: [phone] Medicaid y no asegurados.   [facility] Ofrece grupos de PIO varias veces por semana. Direccin: [address] Martin Anderson [place], [place] [address] [phone] [place] Medicaid y otros seguros.   [facility] Ambulatorio  Programa Ambulatorio Intensivo de Dependencia [place] (IOP) [address] [phone], [place], [place] [phone] Martin Anderson comercial y [place].   [place]  Programa de Hospitalizacin Parcial y [place]  Direccin: [address]  [address], [place], [place] [phone] Seguro privado, [place] solo para hospitalizacin parcial  ACDM Evaluacin y Asesoramiento de Martin, Anderson. [address],  [address], [place], [place] [address] [phone] [place]. Opciones a corto y Air cabin crew.Centro de Salud Conductual del [place]/Cuidado Ur[place] de Salud Conductual (BHUC) PIO, asesoramiento individual, gestin de medicamentos [address] [address], [place], [place] [phone] Medicaid y Sin Seguro  Recursos Conductuales de la [place]ada [address]  [address], [place], [place] [phone] Seguro Privado y Self Pay   [facility] Ambulatorio [address] [place]. [address]  [place], [place] [address] [phone] [place], [place] y Self Pay   Encrucijada: [address]  [address], [place], [place] Servicios de [place] de [place]  [address]  [address], [place], [place] [phone]  Martin Anderson  [address] [place], [place] [address] [phone]    Programas Dionicio Stall Residencial:  [facility] ([facility] [address] [phone] [place], [place] [phone] o [phone] Desintoxicacin y Rehabilitacin Residencial [address] (Medicare, [place], seguro privado y [place]). Sin metadona. Llame para pre-pantalla.   RTS [facility] de [place] Residencial) [address]  [address], [place], [place] [phone] Detox (autopago y disponibilidad limitada de [place]) Rehab Only Male (Medicare, [place] y Self Pay) - Sin metadona.  [facility] de Fellowship [address] [address], [place], [place] [phone] o [phone] [facility] de la [place]: [phone] FAX: [phone] Programa residencial para mujeres de 21 aos o ms por hasta un ao a travs de un modelo cristiano de recuperacin de 12 pasos. Autopago.    Cam[place] de la [place] [address] [address], [place], [place]: [phone] Debe ser desintoxicado 72 horas antes de la admisin; [place] de [address]  Autopago.  Centro de [employer]  [address] [place] [phone] Seguro comercial, Medicare, Medicaid (no [place] directo). Ofrecen asistencia con el transporte.   Misin de [employer] Direccin: [address] [place],  [address], [place], [place] [phone] Programa Basado en Cristianos. Solo hombres. Sin seguro  Programa residencial [facility] [address] de [place] [address], [place], [place] Mujeres: [phone] Mens: [phone] No Medicaid.   Centros de Adiccin de [place] [employer] [place] (principalmente [place]) dispuestas a ayudar con el transporte.  [phone] Martin Anderson de seguros comerciales.Instalacin de [place] Residencial [facility]  [address]  [place], [place] [address] [phone] Solo tratamiento, debe hacer una cita de evaluacin y debe estar sobrio para la cita de evaluacin. Autopago, Medicare A y B, Medicaid del [address] de [place], debe ser residente del [place]. Sin metadona.   [facility]  [address] [address], [place], [place] [phone] Sin cargos legales pendientes, Programa de [place] a Air cabin crew. Sin metadona. Convocatoria de evaluacin.  Centro de recuperacin de [place]  [address] [address], [place], [place] [phone] o [phone] Solo Seguro Comercial  Centros de [place] de Ambrosia Local - [phone] Seguro [place] (sin [place]). Hombres/Mujeres, llamar para hacer referencias, mltiples instalaciones   [facility] Direccin: [address],  [address], [place], [place]  [phone] Solo para Hombres Tarifa por adelantado [facility] Programa para mujeres: [facility] [address] [place], [place] [address] [phone]  [facility], sin [facility] [address] [address], [place], [place] [phone] (f)

## 2023-12-05 NOTE — Hospital Course (Addendum)
 Patient is a 33 year old Spanish-speaking male with past medical history significant for significant alcohol abuse (24-30 beers a day) and tobacco use disorder who presents with epigastric pain, hematemesis, and intolerance of p.o. intake. On arrival to the ED, he was afebrile, tachycardic to 108, BP 107/68, SpO2 90% on RA.  CBC was remarkable for WBC 13.6, macrocytic anemia with an Hgb of 9.6 and MCV of 104.  CMP remarkable for sodium 124, potassium 3.4, AST 47, ALT 23, alk phos 69, T. bili 2.0, anion gap 17.  Lipase was within normal notes.  PT 20.4, INR 1.7.  FOBT positive.  CT abdomen pelvis with contrast showed no acute findings but noted early cirrhosis morphology of the liver as well as 2 foci of low-attenuation in the liver that would require liver MRI with contrast for more definitive characterization.  Patient was admitted for acute upper GI bleed in the setting of severe alcohol use disorder with hyponatremia.

## 2023-12-05 NOTE — Progress Notes (Signed)
 Progress Note   Patient: Martin Anderson FMW:[id number] DOB: 10/31/1990 DOA: 12/04/2023     1 DOS: the patient was seen and examined on 12/05/2023   Brief hospital course: Patient is a 33 year old Spanish-speaking male with past medical history significant for significant alcohol abuse (24-30 beers a day) and tobacco use disorder who presents with epigastric pain, hematemesis, and intolerance of p.o. intake. On arrival to the ED, he was afebrile, tachycardic to 108, BP 107/68, SpO2 90% on RA.  CBC was remarkable for WBC 13.6, macrocytic anemia with an Hgb of 9.6 and MCV of 104.  CMP remarkable for sodium 124, potassium 3.4, AST 47, ALT 23, alk phos 69, T. bili 2.0, anion gap 17.  Lipase was within normal notes.  PT 20.4, INR 1.7.  FOBT positive.  CT abdomen pelvis with contrast showed no acute findings but noted early cirrhosis morphology of the liver as well as 2 foci of low-attenuation in the liver that would require liver MRI with contrast for more definitive characterization.  Patient was admitted for acute upper GI bleed in the setting of severe alcohol use disorder with hyponatremia.  Assessment and Plan:  # Acute blood loss anemia # Hematemesis # Decompensated liver cirrhosis # Esophageal varices # Portal hypertension # Coagulopathy # Gastric ulcer # Macrocytic anemia - Presented with a Hgb of 9.6 (MCV 104)  in the setting of acute onset hematemesis and positive FOBT - CT abdomen/pelvis showed early cirrhosis morphology of the liver as well as 2 foci of low-attenuation in the liver that would require liver MRI with contrast for more definitive characterization - EGD (12/04/2023) showed grade 2 esophageal varices, portal hypertensive gastropathy, nonbleeding gastric erosion, normal examined duodenum.  Initially noted scant fresh blood though not actively bleeding during observation in the hypopharynx. - GI following - H. pylori stool antigen pending collection - Continue  octreotide 50 mcg/hr - Continue IV Protonix 40 mg twice daily - Clear liquid diet - Will require Coreg for variceal bleeding prophylaxis closer to time of discharge - Hgb stable today at 9.1  # Hyponatremia - Likely secondary to beer potomania, cirrhosis, portal hypertension - Na 123 today - Goal correction 4-6 mEq/L in 24 hours - NS at 75 mL/hr for 1 day  # Alcohol abuse disorder - Patient reportedly drinking 24-30 beers per day, last drink was just prior to coming to the hospital. - Counseled patient extensively on the need for alcohol cessation and the risks associated with continued alcohol abuse in the setting of his now diagnosed liver cirrhosis - CIWA protocol with scheduled and PRN Ativan - TOC consulted for alcohol abuse counseling - Folate, thiamine, multivitamin supplementation  #Tobacco use disorder - Reports smoking 1 to 2 packs/day since the age of 18 - Nicotine patch 21 mg daily  # Hypokalemia - resolved      Subjective: Patient was seen at bedside.  iPad interpreter utilized for interview.  Patient denies any further episodes of hematemesis, melena, hematochezia.  States abdominal pain has subsided.  His wife reports some mild shaking but no overt signs of withdrawal yet.  He appears to understand the gravity of his situation.  The need for alcohol cessation was discussed greatly.  Informed the patient that we will be providing him with resources to aid him if he so desires.   Physical Exam: Vitals:   12/05/23 0313 12/05/23 0353 12/05/23 0900 12/05/23 1213  BP:  112/75 (!) 114/96 132/78  Pulse: 67 76 80 89  Resp:  16  16 17  Temp:   98.8 F (37.1 C) 98.9 F (37.2 C)  TempSrc:  Oral Oral Oral  SpO2:  100% 100% 99%  Weight:       Physical Exam Constitutional:      General: He is not in acute distress.    Appearance: He is not ill-appearing.  HENT:     Mouth/Throat:     Mouth: Mucous membranes are moist.  Eyes:     General: Scleral icterus present.   Cardiovascular:     Rate and Rhythm: Normal rate and regular rhythm.     Heart sounds: Normal heart sounds. No murmur heard. Pulmonary:     Effort: Pulmonary effort is normal. No respiratory distress.     Breath sounds: Normal breath sounds. No wheezing.  Abdominal:     General: Bowel sounds are normal. There is no distension.     Palpations: Abdomen is soft.     Tenderness: There is no abdominal tenderness. There is no guarding.  Musculoskeletal:     Right lower leg: No edema.     Left lower leg: No edema.  Skin:    General: Skin is warm and dry.     Capillary Refill: Capillary refill takes less than 2 seconds.  Neurological:     Mental Status: He is alert and oriented to person, place, and time. Mental status is at baseline.     Comments: No asterixis    MELD 3.0: 21 at 12/05/2023  6:04 PM MELD-Na: 24 at 12/05/2023  6:04 PM Calculated from: Serum Creatinine: 0.6 mg/dL (Using min of 1 mg/dL) at 89/74/7974  3:44 AM Serum Sodium: 123 mmol/L (Using min of 125 mmol/L) at 12/05/2023  6:55 AM Total Bilirubin: 1.5 mg/dL at 89/74/7974  3:44 AM Serum Albumin: 3.2 g/dL at 89/74/7974  3:44 AM INR(ratio): 1.6 at 12/05/2023  6:04 PM Age at listing (hypothetical): 33 years Sex: Male at 12/05/2023  6:04 PM    Family Communication: Discussed with wife who is at bedside.  Disposition: Status is: Inpatient Remains inpatient appropriate because: Acute GI bleed on octreotide drip  Planned Discharge Destination: Home  DVT Prophylaxis: SCDs Start: 12/04/23 9173  Time spent: 45 minutes  Author: Duffy Larch, MD 12/05/2023 2:57 PM  For on call review [email or web address].

## 2023-12-05 NOTE — Progress Notes (Signed)
 [facility] Progress Note  Chief Complaint: Upper GI bleed and alcohol-related cirrhosis  History:  No further GI bleeding since admission and prior to yesterday's upper endoscopy.  No black or bloody stool.  Some history obtained from nursing, and I spoke with this patient as well using a video phone interpreter.  Denies chest pain dyspnea or dysuria.  He is hungry and anxious to go home soon.  History reviewed. No pertinent past medical history.  Objective:   Current Facility-Administered Medications:    0.9 %  sodium chloride  infusion, , Intravenous, Continuous, Al-Sultani, Anmar, MD, Last Rate: 75 mL/hr at 12/05/23 1537, New Bag at 12/05/23 1537   acetaminophen  (TYLENOL ) tablet 650 mg, 650 mg, Oral, Q6H PRN **OR** acetaminophen  (TYLENOL ) suppository 650 mg, 650 mg, Rectal, Q6H PRN, Danis, Victory CROME III, MD   albuterol (PROVENTIL) (2.5 MG/3ML) 0.083% nebulizer solution 2.5 mg, 2.5 mg, Nebulization, Q6H PRN, Danis, Victory CROME MOULD, MD   [START ON 12/06/2023] carvedilol (COREG) tablet 3.125 mg, 3.125 mg, Oral, BID WC, Danis, Victory CROME III, MD   folic acid (FOLVITE) tablet 1 mg, 1 mg, Oral, Daily, Danis, Victory CROME III, MD, 1 mg at 12/05/23 0902   LORazepam (ATIVAN) injection 0-4 mg, 0-4 mg, Intravenous, Q6H **FOLLOWED BY** [START ON 12/06/2023] LORazepam (ATIVAN) injection 0-4 mg, 0-4 mg, Intravenous, Q12H, Danis, Victory CROME III, MD   LORazepam (ATIVAN) tablet 1-4 mg, 1-4 mg, Oral, Q1H PRN **OR** LORazepam (ATIVAN) injection 1-4 mg, 1-4 mg, Intravenous, Q1H PRN, Danis, Victory CROME III, MD   morphine (PF) 2 MG/ML injection 2 mg, 2 mg, Intravenous, Q4H PRN, Smith, Rondell A, MD   multivitamin with minerals tablet 1 tablet, 1 tablet, Oral, Daily, Danis, Victory CROME III, MD, 1 tablet at 12/05/23 9097   nicotine (NICODERM CQ - dosed in mg/24 hours) patch 21 mg, 21 mg, Transdermal, Daily, Danis, Victory CROME III, MD, 21 mg at 12/05/23 0902   octreotide (SANDOSTATIN) 2 mcg/mL load via infusion 50 mcg, 50 mcg, Intravenous,  Once **AND** [DISCONTINUED] octreotide (SANDOSTATIN) 500 mcg in sodium chloride  0.9 % 250 mL (2 mcg/mL) infusion, 50 mcg/hr, Intravenous, Continuous, Danis, Victory CROME III, MD, Last Rate: 25 mL/hr at 12/05/23 1140, 50 mcg/hr at 12/05/23 1140   ondansetron  (ZOFRAN ) tablet 4 mg, 4 mg, Oral, Q6H PRN **OR** ondansetron  (ZOFRAN ) injection 4 mg, 4 mg, Intravenous, Q6H PRN, Legrand Victory CROME III, MD   oxyCODONE (Oxy IR/ROXICODONE) immediate release tablet 5 mg, 5 mg, Oral, Q4H PRN, Claudene, Rondell A, MD, 5 mg at 12/05/23 1442   [START ON 12/06/2023] pantoprazole (PROTONIX) EC tablet 40 mg, 40 mg, Oral, BID AC, Danis, Victory CROME III, MD   sodium chloride  flush (NS) 0.9 % injection 3 mL, 3 mL, Intravenous, Q12H, Danis, Victory CROME III, MD, 3 mL at 12/05/23 0902   thiamine (VITAMIN B1) tablet 100 mg, 100 mg, Oral, Daily, 100 mg at 12/05/23 0902 **OR** thiamine (VITAMIN B1) injection 100 mg, 100 mg, Intravenous, Daily, Danis, Victory CROME III, MD, 100 mg at 12/04/23 1004   sodium chloride  75 mL/hr at 12/05/23 1537     Vital signs in last 24 hrs: Vitals:   12/05/23 1213 12/05/23 1516  BP: 132/78 116/79  Pulse: 89 80  Resp: 17 13  Temp: 98.9 F (37.2 C) 98.3 F (36.8 C)  SpO2: 99% 99%    Intake/Output Summary (Last 24 hours) at 12/05/2023 1914 Last data filed at 12/05/2023 1730 Gross per 24 hour  Intake 1246.23 ml  Output 1 ml  Net  1245.23 ml     Physical Exam He is sitting up comfortably in bed watching soccer on television.  He is alert conversational and oriented. HEENT: sclera anicteric, oral mucosa without lesions Neck: supple, no thyromegaly, JVD or lymphadenopathy Cardiac: RRR without murmurs, S1S2 heard, no peripheral edema Pulm: clear to auscultation bilaterally, normal RR and effort noted Abdomen: soft, no tenderness, with active bowel sounds.  No distention.  Left lobe liver felt on deep inspiration Skin; warm and dry, no jaundice Normal gross motor strength bilaterally, fluent speech, no  asterixis Recent Labs:     Latest Ref Rng & Units 12/05/2023    6:04 PM 12/05/2023    4:10 AM 12/04/2023    7:19 PM  CBC  WBC 4.0 - 10.5 K/uL 13.5  8.3    Hemoglobin 13.0 - 17.0 g/dL 8.4  9.1  8.7   Hematocrit 39.0 - 52.0 % 23.7  25.9  24.9   Platelets 150 - 400 K/uL 198  190      Recent Labs  Lab 12/05/23 1804  INR 1.6*      Latest Ref Rng & Units 12/05/2023    6:55 AM 12/04/2023    1:42 AM 06/10/2016    6:01 PM  CMP  Glucose 70 - 99 mg/dL 857  853  886   BUN 6 - 20 mg/dL 10  17  6    Creatinine 0.61 - 1.24 mg/dL 9.39  9.06  9.67   Sodium 135 - 145 mmol/L 123  124  136   Potassium 3.5 - 5.1 mmol/L 3.8  3.4  3.7   Chloride 98 - 111 mmol/L 91  84  100   CO2 22 - 32 mmol/L 22  23  26    Calcium 8.9 - 10.3 mg/dL 7.9  8.7  9.1   Total Protein 6.5 - 8.1 g/dL 7.2  7.8  8.5   Total Bilirubin 0.0 - 1.2 mg/dL 1.5  2.0  0.8   Alkaline Phos 38 - 126 U/L 55  69  90   AST 15 - 41 U/L 43  47  34   ALT 0 - 44 U/L 20  23  35      Radiologic studies:   Assessment & Plan  Assessment:  Hematemesis  Multifactorial anemia with some element of acute GI blood loss but also macrocytosis suggesting alcohol related marrow suppression.  Folic acid B12 and iron levels normal  Alcohol related cirrhosis with esophageal varices, no ascites, no encephalopathy, mild coagulopathy  Nonspecific radiographic findings in the liver on CT abdomen and pelvis, felt too small to characterize.  I suspect his upper GI bleeding most likely came from a diminutive shallow ulcer in the gastric cardia, possibly precipitated by some alcohol related vomiting.  While he did have esophageal varices, they did not have stigmata of bleeding and they were not banded.  Plan: Stop octreotide Start carvedilol 3.125 mg by mouth twice daily tomorrow morning Begin regular diet this evening Alpha-fetoprotein with tomorrow morning's labs Home tomorrow if no events in the interim and cleared by medical service Complete  alcohol cessation strongly advised. We will contact him with office follow-up and hopefully he will be able to do so since he is currently without medical coverage.  Moderate degree of medical complexity for this visit  Victory LITTIE Brand III Office: [phone]

## 2023-12-06 ENCOUNTER — Other Ambulatory Visit (HOSPITAL_COMMUNITY): Payer: Self-pay

## 2023-12-06 LAB — CBC
HCT: 23.1 % — ABNORMAL LOW (ref 39.0–52.0)
Hemoglobin: 8.2 g/dL — ABNORMAL LOW (ref 13.0–17.0)
MCH: 36.9 pg — ABNORMAL HIGH (ref 26.0–34.0)
MCHC: 35.5 g/dL (ref 30.0–36.0)
MCV: 104.1 fL — ABNORMAL HIGH (ref 80.0–100.0)
Platelets: 188 K/uL (ref 150–400)
RBC: 2.22 MIL/uL — ABNORMAL LOW (ref 4.22–5.81)
RDW: 11.3 % — ABNORMAL LOW (ref 11.5–15.5)
WBC: 10.3 K/uL (ref 4.0–10.5)
nRBC: 0 % (ref 0.0–0.2)

## 2023-12-06 LAB — COMPREHENSIVE METABOLIC PANEL WITH GFR
ALT: 33 U/L (ref 0–44)
AST: 73 U/L — ABNORMAL HIGH (ref 15–41)
Albumin: 3.4 g/dL — ABNORMAL LOW (ref 3.5–5.0)
Alkaline Phosphatase: 67 U/L (ref 38–126)
Anion gap: 7 (ref 5–15)
BUN: 7 mg/dL (ref 6–20)
CO2: 20 mmol/L — ABNORMAL LOW (ref 22–32)
Calcium: 8.2 mg/dL — ABNORMAL LOW (ref 8.9–10.3)
Chloride: 102 mmol/L (ref 98–111)
Creatinine, Ser: 0.7 mg/dL (ref 0.61–1.24)
GFR, Estimated: >60 mL/min (ref >60)
Glucose, Bld: 114 mg/dL — ABNORMAL HIGH (ref 70–99)
Potassium: 3.4 mmol/L — ABNORMAL LOW (ref 3.5–5.1)
Sodium: 129 mmol/L — ABNORMAL LOW (ref 135–145)
Total Bilirubin: 0.7 mg/dL (ref 0.0–1.2)
Total Protein: 7.2 g/dL (ref 6.5–8.1)

## 2023-12-06 LAB — HEPATIC FUNCTION PANEL
ALT: 28 U/L (ref 0–44)
AST: 69 U/L — ABNORMAL HIGH (ref 15–41)
Albumin: 3 g/dL — ABNORMAL LOW (ref 3.5–5.0)
Alkaline Phosphatase: 56 U/L (ref 38–126)
Bilirubin, Direct: 0.3 mg/dL — ABNORMAL HIGH (ref 0.0–0.2)
Indirect Bilirubin: 1 mg/dL — ABNORMAL HIGH (ref 0.3–0.9)
Total Bilirubin: 1.3 mg/dL — ABNORMAL HIGH (ref 0.0–1.2)
Total Protein: 6.4 g/dL — ABNORMAL LOW (ref 6.5–8.1)

## 2023-12-06 LAB — BASIC METABOLIC PANEL WITH GFR
Anion gap: 9 (ref 5–15)
BUN: 7 mg/dL (ref 6–20)
CO2: 22 mmol/L (ref 22–32)
Calcium: 8 mg/dL — ABNORMAL LOW (ref 8.9–10.3)
Chloride: 98 mmol/L (ref 98–111)
Creatinine, Ser: 0.6 mg/dL — ABNORMAL LOW (ref 0.61–1.24)
GFR, Estimated: >60 mL/min (ref >60)
Glucose, Bld: 130 mg/dL — ABNORMAL HIGH (ref 70–99)
Potassium: 3.5 mmol/L (ref 3.5–5.1)
Sodium: 129 mmol/L — ABNORMAL LOW (ref 135–145)

## 2023-12-06 LAB — PROTIME-INR
INR: 1.6 — ABNORMAL HIGH (ref 0.8–1.2)
Prothrombin Time: 19.9 s — ABNORMAL HIGH (ref 11.4–15.2)

## 2023-12-06 MED ORDER — MULTI-VITAMIN/MINERALS PO TABS
1.0000 | ORAL_TABLET | Freq: Every day | ORAL | 2 refills | Status: AC
  Filled 2023-12-06: qty 30, 30d supply, fill #0

## 2023-12-06 MED ORDER — FOLIC ACID 1 MG PO TABS
1.0000 mg | ORAL_TABLET | Freq: Every day | ORAL | 2 refills | Status: AC
  Filled 2023-12-06: qty 30, 30d supply, fill #0

## 2023-12-06 MED ORDER — CARVEDILOL 3.125 MG PO TABS
3.1250 mg | ORAL_TABLET | Freq: Two times a day (BID) | ORAL | 0 refills | Status: AC
  Filled 2023-12-06: qty 60, 30d supply, fill #0

## 2023-12-06 MED ORDER — THIAMINE HCL 100 MG PO TABS
100.0000 mg | ORAL_TABLET | Freq: Every day | ORAL | 0 refills | Status: AC
  Filled 2023-12-06: qty 90, 90d supply, fill #0

## 2023-12-06 MED ORDER — POTASSIUM CHLORIDE CRYS ER 20 MEQ PO TBCR
40.0000 meq | EXTENDED_RELEASE_TABLET | Freq: Once | ORAL | Status: AC
  Administered 2023-12-06: 40 meq via ORAL
  Filled 2023-12-06: qty 2

## 2023-12-06 MED ORDER — PANTOPRAZOLE SODIUM 40 MG PO TBEC
40.0000 mg | DELAYED_RELEASE_TABLET | Freq: Two times a day (BID) | ORAL | 0 refills | Status: AC
  Filled 2023-12-06: qty 60, 30d supply, fill #0

## 2023-12-06 NOTE — Plan of Care (Signed)

## 2023-12-06 NOTE — Discharge Summary (Signed)
 Physician Discharge Summary   Patient: Martin Anderson MRN: [id number] DOB: 01/23/1991  Admit date:     12/04/2023  Discharge date: 12/07/23  Discharge Physician: Duffy Al-Sultani   PCP: Pcp, No   Recommendations at discharge:  {Tip this will not be part of the note when signed- Example include specific recommendations for outpatient follow-up, pending tests to follow-up on. (Optional):26781} Follow-up with gastroenterology within 2 to 4 weeks of discharge Establish care with a PCP, follow up on lab work including CBC, CMP, and continue working on alcohol cessation/rehab  Discharge Diagnoses: Principal Problem:   Hematemesis Active Problems:   Acute upper GI bleed   Acute blood loss anemia   Leukocytosis   Elevated liver function tests   Coagulopathy   Abnormal computed tomography angiography (CTA) of abdomen and pelvis   Hyponatremia   Alcohol abuse   Tobacco abuse   Secondary esophageal varices without bleeding (HCC)   Alcoholic cirrhosis of liver without ascites (HCC)   Portal hypertensive gastropathy (HCC)  Resolved Problems:   * No resolved hospital problems. *  Hospital Course: Patient is a 33 year old Spanish-speaking male with past medical history significant for significant alcohol abuse (24-30 beers a day) and tobacco use disorder who presents with epigastric pain, hematemesis, and intolerance of p.o. intake. On arrival to the ED, he was afebrile, tachycardic to 108, BP 107/68, SpO2 90% on RA.  CBC was remarkable for WBC 13.6, macrocytic anemia with an Hgb of 9.6 and MCV of 104.  CMP remarkable for sodium 124, potassium 3.4, AST 47, ALT 23, alk phos 69, T. bili 2.0, anion gap 17.  Lipase was within normal notes.  PT 20.4, INR 1.7.  FOBT positive.  CT abdomen pelvis with contrast showed no acute findings but noted early cirrhosis morphology of the liver as well as 2 foci of low-attenuation in the liver that would require liver MRI with contrast for more definitive  characterization.  Patient was admitted for acute upper GI bleed in the setting of severe alcohol use disorder with hyponatremia.  # Acute blood loss anemia # Hematemesis # Decompensated liver cirrhosis # Esophageal varices # Portal hypertension # Coagulopathy # Gastric ulcer # Macrocytic anemia - Presented with a Hgb of 9.6 (MCV 104)  in the setting of acute onset hematemesis and positive FOBT - CT abdomen/pelvis showed early cirrhosis morphology of the liver as well as 2 foci of low-attenuation in the liver that would require liver MRI with contrast for more definitive characterization - EGD (12/04/2023) showed grade 2 esophageal varices, portal hypertensive gastropathy, nonbleeding gastric erosion, normal examined duodenum.  Initially noted scant fresh blood though not actively bleeding during observation in the hypopharynx. - H. pylori stool antigen pending collection - Octreotide drip discontinued on 10/25 - Was started on Coreg 3.125 mg twice daily for variceal bleeding prophylaxis on 10/26 - Continue PO Protonix 40 mg twice daily - Tolerating diet.  Hemoglobin stable - Will require Coreg for variceal bleeding prophylaxis closer to time of discharge - Hgb stable at 8.2 at time discharge with no evidence of bleeding including hematemesis, hematochezia, melena. - GI cleared for discharge and will arrange for follow-up in the clinic.   # Hyponatremia - Likely secondary to beer potomania, cirrhosis, portal hypertension - Patient was started on gentle IVF with NS at 75 mL an hour with goal correction of 4-6 mg/L in 24 hours - Na improved to ***at the time of discharge - Patient will need to follow-up with PCP to repeat lab  work.  Referral provided to [facility] health internal medicine residency clinic.   # Alcohol abuse disorder - Patient reportedly drinking 24-30 beers per day, last drink was just a few hours prior to coming to the hospital. - Counseled patient extensively on the need  for alcohol cessation and the risks associated with continued alcohol abuse in the setting of his now diagnosed liver cirrhosis - He was maintained on CIWA protocol with scheduled and PRN Ativan though he never required any Ativan - TOC counseled patient on alcohol abuse and provided community resources in Spanish - Folate, thiamine, multivitamin supplementation prescribed at discharge   #Tobacco use disorder - Reports smoking 1 to 2 packs/day since the age of 35 - Nicotine patch 21 mg daily - Patient to discuss tobacco cessation strategies with PCP   # Hypokalemia - resolved    {Tip this will not be part of the note when signed Body mass index is 24.93 kg/m. , ,  (Optional):26781}   Consultants: Gastroenterology Procedures performed: EGD Disposition: Home Diet recommendation:  Diet Orders (From admission, onward)     Start     Ordered   12/05/23 1914  Diet 2 gram sodium Fluid consistency: Thin  Diet effective now       Question:  Fluid consistency:  Answer:  Thin   12/05/23 1914            DISCHARGE MEDICATION: Allergies as of 12/06/2023   No Known Allergies      Medication List     STOP taking these medications    Sleep Aid 25 MG tablet Generic drug: doxylamine (Sleep)       TAKE these medications    carvedilol 3.125 MG tablet Commonly known as: COREG Take 1 tablet (3.125 mg total) by mouth 2 (two) times daily with a meal.   folic acid 1 MG tablet Commonly known as: FOLVITE Tome 1 tableta (1 mg en total) por va oral diariamente. (Take 1 tablet (1 mg total) by mouth daily.) Start taking on: December 07, 2023   multivitamin with minerals tablet Tome 1 tableta por va oral diariamente. (Take 1 tablet by mouth daily.)   pantoprazole 40 MG tablet Commonly known as: PROTONIX Take 1 tablet (40 mg total) by mouth 2 (two) times daily before a meal.   thiamine 100 MG tablet Commonly known as: VITAMIN B1 Tome 1 tableta (100 mg en total) por va oral  diariamente. (Take 1 tablet (100 mg total) by mouth daily.) Start taking on: December 07, 2023        Follow-up Information     Danis, Victory LITTIE MOULD, MD Follow up.   Specialty: Gastroenterology Contact information: [address] [place] [address] [place] [address] [phone]         [facility] Follow up.   Contact information: [address] [place]   [address] [phone]               Discharge Exam: Fredricka Weights   12/04/23 2116  Weight: 61.8 kg   Physical Exam Constitutional:      General: He is not in acute distress.    Appearance: He is not ill-appearing.  HENT:     Mouth/Throat:     Mouth: Mucous membranes are moist.  Eyes:     General: Scleral icterus present.  Cardiovascular:     Rate and Rhythm: Normal rate and regular rhythm.     Heart sounds: Normal heart sounds. No murmur heard. Pulmonary:  Effort: Pulmonary effort is normal. No respiratory distress.     Breath sounds: Normal breath sounds. No wheezing.  Abdominal:     General: Bowel sounds are normal. There is no distension.     Palpations: Abdomen is soft.     Tenderness: There is no abdominal tenderness. There is no guarding.  Musculoskeletal:     Right lower leg: No edema.     Left lower leg: No edema.  Skin:    General: Skin is warm and dry.     Capillary Refill: Capillary refill takes less than 2 seconds.  Neurological:     Mental Status: He is alert and oriented to person, place, and time. Mental status is at baseline.     Comments: No asterixis   Condition at discharge: fair  The results of significant diagnostics from this hospitalization (including imaging, microbiology, ancillary and laboratory) are listed below for reference.   Imaging Studies: CT ABDOMEN PELVIS W CONTRAST Result Date: 12/04/2023 EXAM: CT ABDOMEN AND PELVIS WITH CONTRAST 12/04/2023 04:56:59 AM TECHNIQUE: CT of the abdomen and pelvis was performed with the  administration of 75 mL of iohexol (OMNIPAQUE) 350 MG/ML injection. Multiplanar reformatted images are provided for review. Automated exposure control, iterative reconstruction, and/or weight-based adjustment of the mA/kV was utilized to reduce the radiation dose to as low as reasonably achievable. COMPARISON: None available. CLINICAL HISTORY: Abdominal pain, acute, nonlocalized; vomiting blood. 75cc omni350; Pt arrives POV with wife complaining of abd pain and n/v/d x a week. Pt drinks about 12-18 beers a day with the last drink being 2 hours prior to arrival. States he started throwing up dark blood clots today. Wife states that pt eyes and skin seem ; discolored and are more yellow than normal. FINDINGS: LOWER CHEST: No acute abnormality. LIVER: There is relative hypertrophy of the lateral segment of the left lobe of the liver and caudate lobe of liver. Low-density structure within segment 7 measures 0.6 cm (image 34/4). Within segment 4b there is a focal area of low density measuring approximately 2 cm (image 24/4). Adjacent focal area of low attenuation at a slightly more inferior level measures 2.3 x 1.7 cm. These findings are too small to characterize and indeterminate. GALLBLADDER AND BILE DUCTS: Gallbladder is unremarkable. No biliary ductal dilatation. SPLEEN: No acute abnormality. PANCREAS: No acute abnormality. ADRENAL GLANDS: No acute abnormality. KIDNEYS, URETERS AND BLADDER: No stones in the kidneys or ureters. No hydronephrosis. No perinephric or periureteral stranding. Urinary bladder is unremarkable. GI AND BOWEL: Stomach demonstrates no acute abnormality. There is no bowel obstruction. Appendix demonstrates no acute abnormality. PERITONEUM AND RETROPERITONEUM: No ascites. No free air. VASCULATURE: Aorta is normal in caliber. LYMPH NODES: No lymphadenopathy. REPRODUCTIVE ORGANS: Prostate gland is unremarkable. BONES AND SOFT TISSUES: No acute osseous abnormality. No focal soft tissue abnormality.  IMPRESSION: 1. No acute findings. 2. Morphologic features of the liver which may be seen with early cirrhosis including relative hypertrophy of the lateral segment of the left lobe and caudate lobe of liver. 3. There are 2 foci of low attenuation within segment 4b of the liver which are indeterminate. Potential diagnostic considerations include focal fatty deposition versus benign or malignant liver lesions. Advise more definitive characterization with contrast-enhanced imaging MRI of the liver. Electronically signed by: Waddell Calk MD 12/04/2023 06:14 AM EDT RP Workstation: [id number]    Microbiology: Results for orders placed or performed during the hospital encounter of 06/10/16  Urine culture     Status: Abnormal   Collection Time: 06/10/16  6:01 PM   Specimen: Urine, Random  Result Value Ref Range Status   Specimen Description URINE, RANDOM  Final   Special Requests NONE  Final   Culture MULTIPLE SPECIES PRESENT, SUGGEST RECOLLECTION (A)  Final   Report Status 06/12/2016 FINAL  Final    Labs: CBC: Recent Labs  Lab 12/04/23 0142 12/04/23 0526 12/04/23 1919 12/05/23 0410 12/05/23 1804 12/06/23 0212  WBC 13.6*  --   --  8.3 13.5* 10.3  HGB 9.6* 9.4* 8.7* 9.1* 8.4* 8.2*  HCT 27.3* 26.2* 24.9* 25.9* 23.7* 23.1*  MCV 104.2*  --   --  103.2* 105.3* 104.1*  PLT 204  --   --  190 198 188   Basic Metabolic Panel: Recent Labs  Lab 12/04/23 0142 12/04/23 1919 12/05/23 0655 12/06/23 0212  NA 124*  --  123* 129*  K 3.4*  --  3.8 3.5  CL 84*  --  91* 98  CO2 23  --  22 22  GLUCOSE 146*  --  142* 130*  BUN 17  --  10 7  CREATININE 0.93  --  0.60* 0.60*  CALCIUM 8.7*  --  7.9* 8.0*  MG  --  2.0  --   --   PHOS  --  3.2  --   --    Liver Function Tests: Recent Labs  Lab 12/04/23 0142 12/05/23 0655 12/06/23 0212  AST 47* 43* 69*  ALT 23 20 28   ALKPHOS 69 55 56  BILITOT 2.0* 1.5* 1.3*  PROT 7.8 7.2 6.4*  ALBUMIN 3.7 3.2* 3.0*   CBG: No results for input(s): GLUCAP  in the last 168 hours.  Time Coordinating Discharge: I spent a total of 35 minutes engaged in face-to-face discussion with the patient and/or caregivers regarding the patient's care, assessment, plan, and discharge disposition. Over 50% of this time was dedicated to counseling the patient on the risks and benefits of treatment options and the discharge plan, as well as coordinating post-discharge care.   Signed: Idalia Allbritton Al-Sultani, MD [facility] 12/06/2023

## 2023-12-06 NOTE — TOC Initial Note (Addendum)
 Transition of Care [facility]) - Initial/Assessment Note    Patient Details  Name: Martin Anderson MRN: [id number] Date of Birth: 08-16-1990  Transition of [facility]) CM/SW Contact:    Isaiah Public, LCSWA Phone Number: 12/06/2023, 1:14 PM  Clinical Narrative:                    CSW spoke with patient at bedside using Status video interpreter Caprice # [phone] . Patient reports he lives at home with family. Patient reports his plan at dc is to return home with his family. Patient confirms he will have transportation when medically ready. CSW offered patient outpatient substance use treatment services resources in spanish. Patient accepted. CSW lost connection with stratus video interpreter. CSW called back and was connected with [facility] # [phone] stratus video interpreter. CSW answered all patients Questions regarding resources. No further questions reported at this time.      Patient Goals and CMS Choice            Expected Discharge Plan and Services                                              Prior Living Arrangements/Services                       Activities of Daily Living      Permission Sought/Granted                  Emotional Assessment              Admission diagnosis:  Hematemesis [K92.0] Hyponatremia [E87.1] Acute upper GI bleed [K92.2] Acute alcoholic gastritis with hemorrhage [K29.21] Patient Active Problem List   Diagnosis Date Noted   Alcoholic cirrhosis of liver without ascites (HCC) 12/05/2023   Portal hypertensive gastropathy (HCC) 12/05/2023   Hematemesis 12/04/2023   Acute upper GI bleed 12/04/2023   Acute blood loss anemia 12/04/2023   Secondary esophageal varices without bleeding (HCC) 12/04/2023   Leukocytosis 12/04/2023   Elevated liver function tests 12/04/2023   Hyponatremia 12/04/2023   Coagulopathy 12/04/2023   Abnormal computed tomography angiography (CTA) of abdomen and pelvis 12/04/2023   Alcohol abuse  12/04/2023   Tobacco abuse 12/04/2023   PCP:  Pcp, No Pharmacy:   [facility] [phone] - [place], [place] - [address] AT [facility] [address] [place] [address] Phone: [phone] Fax: [phone]  [facility] [address] [place] [address] Phone: [phone] Fax: [phone]     Social Drivers of Health (SDOH) Social History: SDOH Screenings   Tobacco Use: High Risk (12/04/2023)   SDOH Interventions:     Readmission Risk Interventions     No data to display

## 2023-12-06 NOTE — TOC CM/SW Note (Signed)
 Transition of Care (TOC) CM/SW Note    CSW attached outpatients substance use treatment services resources to patients AVS in spanish.

## 2023-12-06 NOTE — Progress Notes (Signed)
 Progress Note   Patient: Martin Anderson FMW:[id number] DOB: 02-26-1990 DOA: 12/04/2023     2 DOS: the patient was seen and examined on 12/06/2023   Brief hospital course: Patient is a 33 year old Spanish-speaking male with past medical history significant for significant alcohol abuse (24-30 beers a day) and tobacco use disorder who presents with epigastric pain, hematemesis, and intolerance of p.o. intake. On arrival to the ED, he was afebrile, tachycardic to 108, BP 107/68, SpO2 90% on RA.  CBC was remarkable for WBC 13.6, macrocytic anemia with an Hgb of 9.6 and MCV of 104.  CMP remarkable for sodium 124, potassium 3.4, AST 47, ALT 23, alk phos 69, T. bili 2.0, anion gap 17.  Lipase was within normal notes.  PT 20.4, INR 1.7.  FOBT positive.  CT abdomen pelvis with contrast showed no acute findings but noted early cirrhosis morphology of the liver as well as 2 foci of low-attenuation in the liver that would require liver MRI with contrast for more definitive characterization.  Patient was admitted for acute upper GI bleed in the setting of severe alcohol use disorder with hyponatremia.  Assessment and Plan: # Acute blood loss anemia # Hematemesis # Decompensated liver cirrhosis # Esophageal varices # Portal hypertension # Coagulopathy # Gastric ulcer # Macrocytic anemia - Presented with a Hgb of 9.6 (MCV 104)  in the setting of acute onset hematemesis and positive FOBT - CT abdomen/pelvis showed early cirrhosis morphology of the liver as well as 2 foci of low-attenuation in the liver that would require liver MRI with contrast for more definitive characterization - EGD (12/04/2023) showed grade 2 esophageal varices, portal hypertensive gastropathy, nonbleeding gastric erosion, normal examined duodenum.  Initially noted scant fresh blood though not actively bleeding during observation in the hypopharynx. - H. pylori stool antigen pending collection - Octreotide drip discontinued on  10/25 - Was started on Coreg 3.125 mg twice daily for variceal bleeding prophylaxis on 10/26 - Continue PO Protonix 40 mg twice daily - Tolerating diet.  Hemoglobin stable - GI cleared for discharge and will arrange for follow-up in the clinic   # Alcohol abuse disorder - Patient reportedly drinking 18-30 beers per day - Last drink was a couple of hours prior to presenting to the ED ~ 11 PM 10/23 - Has family history of alcoholism in his father, brother, and uncles - Counseled patient extensively on the need for alcohol cessation and the risks associated with continued alcohol abuse in the setting of his now diagnosed liver cirrhosis - Patient reports being serious about detoxing and would like to remain hospitalized for withdrawal monitoring - Continue CIWA protocol with scheduled and PRN Ativan  - Has not triggered any Ativan doses yet - Folate, thiamine, multivitamin supplementation   # Hyponatremia - improving - Likely secondary to beer potomania, cirrhosis, portal hypertension - Patient was started on gentle IVF with NS at 75 mL an hour with goal correction of 4-6 mg/L in 24 hours - Na improved to 129 - Will hold off on further IVF as patient is eating  #Tobacco use disorder - Reports smoking 1 to 2 packs/day since the age of 76 - Nicotine patch 21 mg daily - Patient to discuss tobacco cessation strategies with PCP   # Hypokalemia - Repleted      Subjective: Patient seen at bedside. Patient's wife and daughter at bedside. Video translator on iPad utilized. He was eating homemade food. He reports feeling well. Denies any symptoms of withdrawal. Denies any  hematemesis, hematochezia, or melena. Denies abdominal pain, nausea, vomiting. Discussed that he would like to stop. His wife was concerned about discharge incase he goes into withdrawal. Patient agreeable to continue hospitalization for detox.   Physical Exam: Vitals:   12/06/23 0300 12/06/23 0923 12/06/23 1523 12/06/23  2009  BP: 107/78 119/79 106/78 117/76  Pulse:  77 79 80  Resp:  11 17 15   Temp:  98.5 F (36.9 C) 98.6 F (37 C) 98.3 F (36.8 C)  TempSrc:  Oral Oral Oral  SpO2:  100% 98% 100%  Weight:       Physical Exam Constitutional:      General: He is not in acute distress.    Appearance: He is not ill-appearing.  HENT:     Mouth/Throat:     Mouth: Mucous membranes are moist.  Eyes:     General: Scleral icterus present.     Pupils: Pupils are equal, round, and reactive to light.  Cardiovascular:     Rate and Rhythm: Normal rate and regular rhythm.     Heart sounds: Normal heart sounds. No murmur heard. Pulmonary:     Effort: Pulmonary effort is normal. No respiratory distress.     Breath sounds: Normal breath sounds. No wheezing.  Abdominal:     General: Bowel sounds are normal. There is no distension.     Palpations: Abdomen is soft.     Tenderness: There is no abdominal tenderness. There is no guarding.  Musculoskeletal:     Right lower leg: No edema.     Left lower leg: No edema.  Skin:    General: Skin is warm and dry.     Capillary Refill: Capillary refill takes less than 2 seconds.  Neurological:     Mental Status: He is alert and oriented to person, place, and time. Mental status is at baseline.     Sensory: Sensation is intact.     Motor: Motor function is intact.     Coordination: Coordination is intact.     Comments: Mild BUE shaking noted. No asterixis.      Family Communication: Patient's wife at bedside  Disposition: Status is: Inpatient Remains inpatient appropriate because: monitoring for alcohol withdrawal  Planned Discharge Destination: Home    Time spent: 55 minutes  Author: Erinn Huskins Al-Sultani, MD 12/06/2023 10:31 PM  For on call review [email or web address].

## 2023-12-07 ENCOUNTER — Other Ambulatory Visit (HOSPITAL_COMMUNITY): Payer: Self-pay

## 2023-12-07 ENCOUNTER — Encounter (HOSPITAL_COMMUNITY): Payer: Self-pay | Admitting: Gastroenterology

## 2023-12-07 LAB — COMPREHENSIVE METABOLIC PANEL WITH GFR
ALT: 31 U/L (ref 0–44)
AST: 60 U/L — ABNORMAL HIGH (ref 15–41)
Albumin: 2.9 g/dL — ABNORMAL LOW (ref 3.5–5.0)
Alkaline Phosphatase: 82 U/L (ref 38–126)
Anion gap: 8 (ref 5–15)
BUN: <5 mg/dL — ABNORMAL LOW (ref 6–20)
CO2: 20 mmol/L — ABNORMAL LOW (ref 22–32)
Calcium: 8.2 mg/dL — ABNORMAL LOW (ref 8.9–10.3)
Chloride: 100 mmol/L (ref 98–111)
Creatinine, Ser: 0.6 mg/dL — ABNORMAL LOW (ref 0.61–1.24)
GFR, Estimated: >60 mL/min (ref >60)
Glucose, Bld: 102 mg/dL — ABNORMAL HIGH (ref 70–99)
Potassium: 4.1 mmol/L (ref 3.5–5.1)
Sodium: 128 mmol/L — ABNORMAL LOW (ref 135–145)
Total Bilirubin: 0.6 mg/dL (ref 0.0–1.2)
Total Protein: 6.2 g/dL — ABNORMAL LOW (ref 6.5–8.1)

## 2023-12-07 LAB — CBC
HCT: 23.6 % — ABNORMAL LOW (ref 39.0–52.0)
Hemoglobin: 7.9 g/dL — ABNORMAL LOW (ref 13.0–17.0)
MCH: 36.4 pg — ABNORMAL HIGH (ref 26.0–34.0)
MCHC: 33.5 g/dL (ref 30.0–36.0)
MCV: 108.8 fL — ABNORMAL HIGH (ref 80.0–100.0)
Platelets: 203 K/uL (ref 150–400)
RBC: 2.17 MIL/uL — ABNORMAL LOW (ref 4.22–5.81)
RDW: 11.3 % — ABNORMAL LOW (ref 11.5–15.5)
WBC: 12.5 K/uL — ABNORMAL HIGH (ref 4.0–10.5)
nRBC: 0 % (ref 0.0–0.2)

## 2023-12-07 LAB — AFP TUMOR MARKER: AFP, Serum, Tumor Marker: 2.8 ng/mL (ref 0.0–6.9)

## 2023-12-07 NOTE — Plan of Care (Signed)

## 2023-12-10 ENCOUNTER — Encounter: Payer: Self-pay | Admitting: Physician Assistant

## 2024-01-25 ENCOUNTER — Ambulatory Visit: Payer: Self-pay

## 2024-01-27 ENCOUNTER — Ambulatory Visit: Payer: Self-pay | Admitting: Gastroenterology

## 2024-01-27 NOTE — Progress Notes (Deleted)
 CALAHAN PAK [id number] 1990-12-02   Chief Complaint:  Referring Provider: No ref. provider found Primary GI MD: Dr. Legrand   HPI: Martin Anderson is a 33 y.o. male with past medical history of alcohol abuse, tobacco use disorder who presents today for hospital follow-up.    Patient hospitalized 12/04/2023 to 12/07/2023 for GI bleed.  Presented with epigastric pain, hematemesis, and intolerance to p.o. intake.  CBC was remarkable for WBC 13.6, macrocytic anemia with hemoglobin of 9.6 and MCV of 104.  CMP remarkable for sodium 124, potassium 3.4, AST 47, ALT 23, alk phos 69, T. bili 2.0, anion gap 17.  Lipase was within normal limits.  PT 20.4, INR 1.7, FOBT positive. CT A/P showed no acute findings but noted early cirrhosis morphology of the liver as well as 2 foci of low-attenuation in the liver that would require liver MRI with contrast for more definitive characterization.  EGD 12/04/2023 showed grade 2 esophageal varices, portal hypertensive gastropathy, nonbleeding gastric erosion, normal examined duodenum.  He was started on Coreg  3.125 mg twice daily for variceal bleeding prophylaxis, advised to continue Protonix  40 mg twice daily.  Hemoglobin stable at 8.2 at time of discharge with no evidence of bleeding.  Advised to follow-up with GI in clinic.  Patient endorsed drinking 24-30 beers per day, last drink was just a few hours prior to coming to the hospital.  He was counseled extensively on need for alcohol cessation, maintained on CIWA protocol with scheduled and as needed Ativan  though he never required any Ativan . Folate, thiamine , multivitamin supplementation prescribed at discharge.  AFP tumor marker was 2.8, normal.  MELD 3.0: 19 at 12/07/2023  2:56 AM MELD-Na: 20 at 12/07/2023  2:56 AM Calculated from: Serum Creatinine: 0.6 mg/dL (Using min of 1 mg/dL) at 89/72/7974  7:43 AM Serum Sodium: 128 mmol/L at 12/07/2023  2:56 AM Total Bilirubin: 0.6 mg/dL (Using min of  1 mg/dL) at 89/72/7974  7:43 AM Serum Albumin: 2.9 g/dL at 89/72/7974  7:43 AM INR(ratio): 1.6 at 12/06/2023  2:12 AM Age at listing (hypothetical): 33 years Sex: Male at 12/07/2023  2:56 AM    Discussed the use of AI scribe software for clinical note transcription with the patient, who gave verbal consent to proceed.  History of Present Illness       Previous GI Procedures/Imaging   CT A/P 12/04/2023 IMPRESSION: 1. No acute findings. 2. Morphologic features of the liver which may be seen with early cirrhosis including relative hypertrophy of the lateral segment of the left lobe and caudate lobe of liver. 3. There are 2 foci of low attenuation within segment 4b of the liver which are indeterminate. Potential diagnostic considerations include focal fatty deposition versus benign or malignant liver lesions. Advise more definitive characterization with contrast-enhanced imaging MRI of the liver.  EGD 12/04/2023 - Grade II esophageal varices.  - Portal hypertensive gastropathy.  - Gastric erosion with no bleeding. Cannot be certain, but may have been bleeding site. Not overlying a varix and not amenable to endoscopic intervention.  - Normal examined duodenum.  - No specimens collected.  - Scant fresh blood ( not actively bleding during observation) in the hypopharynx   No past medical history on file.  Past Surgical History:  Procedure Laterality Date   ESOPHAGOGASTRODUODENOSCOPY N/A 12/04/2023   Procedure: EGD (ESOPHAGOGASTRODUODENOSCOPY);  Surgeon: Legrand Victory LITTIE DOUGLAS, MD;  Location: [facility] ENDOSCOPY;  Service: Gastroenterology;  Laterality: N/A;    Current Outpatient Medications  Medication Sig Dispense Refill  carvedilol  (COREG ) 3.125 MG tablet Take 1 tablet (3.125 mg total) by mouth 2 (two) times daily with a meal. 60 tablet 0   folic acid  (FOLVITE ) 1 MG tablet Take 1 tablet (1 mg total) by mouth daily. 30 tablet 2   Multiple Vitamins-Minerals (MULTIVITAMIN WITH MINERALS)  tablet Take 1 tablet by mouth daily. 30 tablet 2   pantoprazole  (PROTONIX ) 40 MG tablet Take 1 tablet (40 mg total) by mouth 2 (two) times daily before a meal. 60 tablet 0   thiamine  (VITAMIN B1) 100 MG tablet Take 1 tablet (100 mg total) by mouth daily. 90 tablet 0   No current facility-administered medications for this visit.    Allergies as of 01/27/2024   (No Known Allergies)    No family history on file.  Social History[1]   Review of Systems:    Constitutional: No weight loss, fever, chills, weakness or fatigue Eyes: No change in vision Ears, Nose, Throat:  No change in hearing or congestion Skin: No rash or itching Cardiovascular: No chest pain, chest pressure or palpitations   Respiratory: No SOB or cough Gastrointestinal: See HPI and otherwise negative Genitourinary: No dysuria or change in urinary frequency Neurological: No headache, dizziness or syncope Musculoskeletal: No new muscle or joint pain Hematologic: No bleeding or bruising    Physical Exam:  Vital signs: There were no vitals taken for this visit.  Constitutional: NAD, Well developed, Well nourished, alert and cooperative Head:  Normocephalic and atraumatic.  Eyes: No scleral icterus. Conjunctiva pink. Mouth: No oral lesions. Respiratory: Respirations even and unlabored. Lungs clear to auscultation bilaterally.  No wheezes, crackles, or rhonchi.  Cardiovascular:  Regular rate and rhythm. No murmurs. No peripheral edema. Gastrointestinal:  Soft, nondistended, nontender. No rebound or guarding. Normal bowel sounds. No appreciable masses or hepatomegaly. Rectal:  Not performed.  Neurologic:  Alert and oriented x4;  grossly normal neurologically.  Skin:   Dry and intact without significant lesions or rashes. Psychiatric: Oriented to person, place and time. Demonstrates good judgement and reason without abnormal affect or behaviors.   RELEVANT LABS AND IMAGING: CBC    Component Value Date/Time   WBC  12.5 (H) 12/07/2023 0256   RBC 2.17 (L) 12/07/2023 0256   HGB 7.9 (L) 12/07/2023 0256   HCT 23.6 (L) 12/07/2023 0256   PLT 203 12/07/2023 0256   MCV 108.8 (H) 12/07/2023 0256   MCH 36.4 (H) 12/07/2023 0256   MCHC 33.5 12/07/2023 0256   RDW 11.3 (L) 12/07/2023 0256    CMP     Component Value Date/Time   NA 128 (L) 12/07/2023 0256   K 4.1 12/07/2023 0256   CL 100 12/07/2023 0256   CO2 20 (L) 12/07/2023 0256   GLUCOSE 102 (H) 12/07/2023 0256   BUN <5 (L) 12/07/2023 0256   CREATININE 0.60 (L) 12/07/2023 0256   CALCIUM 8.2 (L) 12/07/2023 0256   PROT 6.2 (L) 12/07/2023 0256   ALBUMIN 2.9 (L) 12/07/2023 0256   AST 60 (H) 12/07/2023 0256   ALT 31 12/07/2023 0256   ALKPHOS 82 12/07/2023 0256   BILITOT 0.6 12/07/2023 0256   GFRNONAA >60 12/07/2023 0256   GFRAA >60 06/10/2016 1801     Assessment/Plan:   Assessment & Plan   Make sure taking meds and has refills Will need an MRI to follow-up on liver lesions Need to repeat MELD labs Counsel on alcohol cessation if he has not stopped Need labs to check for viral hepatitis     Camie Furbish, PA-C  [facility] 01/27/2024, 8:27 AM  Patient Care Team: Pcp, No as PCP - General      [1]  Social History Tobacco Use   Smoking status: Every Day    Types: Cigarettes   Smokeless tobacco: Never
